# Patient Record
Sex: Female | Born: 1935 | Race: White | Hispanic: No | Smoking: Never smoker
Health system: Southern US, Community
[De-identification: ages and names within clinical notes are randomized; demographics above are authoritative.]

## PROBLEM LIST (undated history)

## (undated) HISTORY — PX: ABDOMINAL HYSTERECTOMY: SHX81

## (undated) HISTORY — PX: BREAST SURGERY: SHX581

## (undated) HISTORY — PX: BACK SURGERY: SHX140

## (undated) HISTORY — PX: CHOLECYSTECTOMY: SHX55

---

## 2013-05-30 DIAGNOSIS — K7689 Other specified diseases of liver: Secondary | ICD-10-CM | POA: Diagnosis not present

## 2013-05-30 DIAGNOSIS — Z950 Presence of cardiac pacemaker: Secondary | ICD-10-CM | POA: Diagnosis not present

## 2013-05-30 DIAGNOSIS — R404 Transient alteration of awareness: Secondary | ICD-10-CM | POA: Diagnosis not present

## 2013-05-30 DIAGNOSIS — N390 Urinary tract infection, site not specified: Secondary | ICD-10-CM | POA: Diagnosis not present

## 2013-05-30 DIAGNOSIS — Z885 Allergy status to narcotic agent status: Secondary | ICD-10-CM | POA: Diagnosis not present

## 2013-05-30 DIAGNOSIS — N3090 Cystitis, unspecified without hematuria: Secondary | ICD-10-CM | POA: Diagnosis not present

## 2013-05-30 DIAGNOSIS — E785 Hyperlipidemia, unspecified: Secondary | ICD-10-CM | POA: Diagnosis not present

## 2013-05-30 DIAGNOSIS — Z9889 Other specified postprocedural states: Secondary | ICD-10-CM | POA: Diagnosis not present

## 2013-05-30 DIAGNOSIS — R5383 Other fatigue: Secondary | ICD-10-CM | POA: Diagnosis not present

## 2013-05-30 DIAGNOSIS — K449 Diaphragmatic hernia without obstruction or gangrene: Secondary | ICD-10-CM | POA: Diagnosis not present

## 2013-05-30 DIAGNOSIS — R0602 Shortness of breath: Secondary | ICD-10-CM | POA: Diagnosis not present

## 2013-05-30 DIAGNOSIS — Z882 Allergy status to sulfonamides status: Secondary | ICD-10-CM | POA: Diagnosis not present

## 2013-05-30 DIAGNOSIS — Z96649 Presence of unspecified artificial hip joint: Secondary | ICD-10-CM | POA: Diagnosis not present

## 2013-05-30 DIAGNOSIS — R55 Syncope and collapse: Secondary | ICD-10-CM | POA: Diagnosis not present

## 2013-05-30 DIAGNOSIS — R5381 Other malaise: Secondary | ICD-10-CM | POA: Diagnosis not present

## 2013-05-30 DIAGNOSIS — R4182 Altered mental status, unspecified: Secondary | ICD-10-CM | POA: Diagnosis not present

## 2013-05-30 DIAGNOSIS — R791 Abnormal coagulation profile: Secondary | ICD-10-CM | POA: Diagnosis not present

## 2013-05-30 DIAGNOSIS — I4891 Unspecified atrial fibrillation: Secondary | ICD-10-CM | POA: Diagnosis not present

## 2013-05-30 DIAGNOSIS — R1011 Right upper quadrant pain: Secondary | ICD-10-CM | POA: Diagnosis not present

## 2013-05-30 DIAGNOSIS — J329 Chronic sinusitis, unspecified: Secondary | ICD-10-CM | POA: Diagnosis not present

## 2013-05-30 DIAGNOSIS — Z96659 Presence of unspecified artificial knee joint: Secondary | ICD-10-CM | POA: Diagnosis not present

## 2013-05-30 DIAGNOSIS — J069 Acute upper respiratory infection, unspecified: Secondary | ICD-10-CM | POA: Diagnosis not present

## 2013-05-30 DIAGNOSIS — Z7982 Long term (current) use of aspirin: Secondary | ICD-10-CM | POA: Diagnosis not present

## 2013-05-30 DIAGNOSIS — J40 Bronchitis, not specified as acute or chronic: Secondary | ICD-10-CM | POA: Diagnosis not present

## 2013-05-30 DIAGNOSIS — B961 Klebsiella pneumoniae [K. pneumoniae] as the cause of diseases classified elsewhere: Secondary | ICD-10-CM | POA: Diagnosis not present

## 2013-05-30 DIAGNOSIS — Z888 Allergy status to other drugs, medicaments and biological substances status: Secondary | ICD-10-CM | POA: Diagnosis not present

## 2013-05-30 DIAGNOSIS — I10 Essential (primary) hypertension: Secondary | ICD-10-CM | POA: Diagnosis not present

## 2013-05-30 DIAGNOSIS — Z853 Personal history of malignant neoplasm of breast: Secondary | ICD-10-CM | POA: Diagnosis not present

## 2013-05-30 DIAGNOSIS — I2510 Atherosclerotic heart disease of native coronary artery without angina pectoris: Secondary | ICD-10-CM | POA: Diagnosis not present

## 2013-05-30 DIAGNOSIS — R059 Cough, unspecified: Secondary | ICD-10-CM | POA: Diagnosis not present

## 2013-05-30 DIAGNOSIS — F411 Generalized anxiety disorder: Secondary | ICD-10-CM | POA: Diagnosis not present

## 2013-05-30 DIAGNOSIS — Z79899 Other long term (current) drug therapy: Secondary | ICD-10-CM | POA: Diagnosis not present

## 2013-05-30 DIAGNOSIS — R05 Cough: Secondary | ICD-10-CM | POA: Diagnosis not present

## 2013-05-31 DIAGNOSIS — R7309 Other abnormal glucose: Secondary | ICD-10-CM | POA: Diagnosis not present

## 2013-05-31 DIAGNOSIS — J40 Bronchitis, not specified as acute or chronic: Secondary | ICD-10-CM | POA: Diagnosis not present

## 2013-05-31 DIAGNOSIS — I10 Essential (primary) hypertension: Secondary | ICD-10-CM | POA: Diagnosis not present

## 2013-05-31 DIAGNOSIS — I4891 Unspecified atrial fibrillation: Secondary | ICD-10-CM | POA: Diagnosis not present

## 2013-05-31 DIAGNOSIS — R55 Syncope and collapse: Secondary | ICD-10-CM | POA: Diagnosis not present

## 2013-05-31 DIAGNOSIS — N390 Urinary tract infection, site not specified: Secondary | ICD-10-CM | POA: Diagnosis not present

## 2013-06-01 DIAGNOSIS — R059 Cough, unspecified: Secondary | ICD-10-CM | POA: Diagnosis not present

## 2013-06-01 DIAGNOSIS — R5381 Other malaise: Secondary | ICD-10-CM | POA: Diagnosis not present

## 2013-06-01 DIAGNOSIS — J40 Bronchitis, not specified as acute or chronic: Secondary | ICD-10-CM | POA: Diagnosis not present

## 2013-06-01 DIAGNOSIS — B9689 Other specified bacterial agents as the cause of diseases classified elsewhere: Secondary | ICD-10-CM | POA: Diagnosis not present

## 2013-06-01 DIAGNOSIS — R05 Cough: Secondary | ICD-10-CM | POA: Diagnosis not present

## 2013-06-02 DIAGNOSIS — N390 Urinary tract infection, site not specified: Secondary | ICD-10-CM | POA: Diagnosis not present

## 2013-06-02 DIAGNOSIS — R059 Cough, unspecified: Secondary | ICD-10-CM | POA: Diagnosis not present

## 2013-06-02 DIAGNOSIS — J40 Bronchitis, not specified as acute or chronic: Secondary | ICD-10-CM | POA: Diagnosis not present

## 2013-06-02 DIAGNOSIS — R55 Syncope and collapse: Secondary | ICD-10-CM | POA: Diagnosis not present

## 2013-06-02 DIAGNOSIS — R05 Cough: Secondary | ICD-10-CM | POA: Diagnosis not present

## 2013-06-04 DIAGNOSIS — F411 Generalized anxiety disorder: Secondary | ICD-10-CM | POA: Diagnosis not present

## 2013-06-04 DIAGNOSIS — B961 Klebsiella pneumoniae [K. pneumoniae] as the cause of diseases classified elsewhere: Secondary | ICD-10-CM | POA: Diagnosis not present

## 2013-06-04 DIAGNOSIS — G589 Mononeuropathy, unspecified: Secondary | ICD-10-CM | POA: Diagnosis not present

## 2013-06-04 DIAGNOSIS — M810 Age-related osteoporosis without current pathological fracture: Secondary | ICD-10-CM | POA: Diagnosis not present

## 2013-06-04 DIAGNOSIS — N390 Urinary tract infection, site not specified: Secondary | ICD-10-CM | POA: Diagnosis not present

## 2013-06-04 DIAGNOSIS — IMO0002 Reserved for concepts with insufficient information to code with codable children: Secondary | ICD-10-CM | POA: Diagnosis not present

## 2013-06-04 DIAGNOSIS — Z853 Personal history of malignant neoplasm of breast: Secondary | ICD-10-CM | POA: Diagnosis not present

## 2013-06-04 DIAGNOSIS — H3530 Unspecified macular degeneration: Secondary | ICD-10-CM | POA: Diagnosis not present

## 2013-06-04 DIAGNOSIS — M545 Low back pain, unspecified: Secondary | ICD-10-CM | POA: Diagnosis not present

## 2013-06-04 DIAGNOSIS — I10 Essential (primary) hypertension: Secondary | ICD-10-CM | POA: Diagnosis not present

## 2013-06-04 DIAGNOSIS — J40 Bronchitis, not specified as acute or chronic: Secondary | ICD-10-CM | POA: Diagnosis not present

## 2013-06-04 DIAGNOSIS — Z950 Presence of cardiac pacemaker: Secondary | ICD-10-CM | POA: Diagnosis not present

## 2013-06-05 DIAGNOSIS — J40 Bronchitis, not specified as acute or chronic: Secondary | ICD-10-CM | POA: Diagnosis not present

## 2013-06-05 DIAGNOSIS — B961 Klebsiella pneumoniae [K. pneumoniae] as the cause of diseases classified elsewhere: Secondary | ICD-10-CM | POA: Diagnosis not present

## 2013-06-05 DIAGNOSIS — IMO0002 Reserved for concepts with insufficient information to code with codable children: Secondary | ICD-10-CM | POA: Diagnosis not present

## 2013-06-05 DIAGNOSIS — G589 Mononeuropathy, unspecified: Secondary | ICD-10-CM | POA: Diagnosis not present

## 2013-06-05 DIAGNOSIS — N390 Urinary tract infection, site not specified: Secondary | ICD-10-CM | POA: Diagnosis not present

## 2013-06-05 DIAGNOSIS — I10 Essential (primary) hypertension: Secondary | ICD-10-CM | POA: Diagnosis not present

## 2013-06-06 DIAGNOSIS — N390 Urinary tract infection, site not specified: Secondary | ICD-10-CM | POA: Diagnosis not present

## 2013-06-06 DIAGNOSIS — I10 Essential (primary) hypertension: Secondary | ICD-10-CM | POA: Diagnosis not present

## 2013-06-06 DIAGNOSIS — R5381 Other malaise: Secondary | ICD-10-CM | POA: Diagnosis not present

## 2013-06-06 DIAGNOSIS — F411 Generalized anxiety disorder: Secondary | ICD-10-CM | POA: Diagnosis not present

## 2013-06-06 DIAGNOSIS — IMO0002 Reserved for concepts with insufficient information to code with codable children: Secondary | ICD-10-CM | POA: Diagnosis not present

## 2013-06-06 DIAGNOSIS — G589 Mononeuropathy, unspecified: Secondary | ICD-10-CM | POA: Diagnosis not present

## 2013-06-06 DIAGNOSIS — I4949 Other premature depolarization: Secondary | ICD-10-CM | POA: Diagnosis not present

## 2013-06-06 DIAGNOSIS — R5383 Other fatigue: Secondary | ICD-10-CM | POA: Diagnosis not present

## 2013-06-06 DIAGNOSIS — B961 Klebsiella pneumoniae [K. pneumoniae] as the cause of diseases classified elsewhere: Secondary | ICD-10-CM | POA: Diagnosis not present

## 2013-06-06 DIAGNOSIS — J40 Bronchitis, not specified as acute or chronic: Secondary | ICD-10-CM | POA: Diagnosis not present

## 2013-06-10 DIAGNOSIS — B961 Klebsiella pneumoniae [K. pneumoniae] as the cause of diseases classified elsewhere: Secondary | ICD-10-CM | POA: Diagnosis not present

## 2013-06-10 DIAGNOSIS — G589 Mononeuropathy, unspecified: Secondary | ICD-10-CM | POA: Diagnosis not present

## 2013-06-10 DIAGNOSIS — I10 Essential (primary) hypertension: Secondary | ICD-10-CM | POA: Diagnosis not present

## 2013-06-10 DIAGNOSIS — J40 Bronchitis, not specified as acute or chronic: Secondary | ICD-10-CM | POA: Diagnosis not present

## 2013-06-10 DIAGNOSIS — N390 Urinary tract infection, site not specified: Secondary | ICD-10-CM | POA: Diagnosis not present

## 2013-06-10 DIAGNOSIS — IMO0002 Reserved for concepts with insufficient information to code with codable children: Secondary | ICD-10-CM | POA: Diagnosis not present

## 2013-06-12 DIAGNOSIS — G589 Mononeuropathy, unspecified: Secondary | ICD-10-CM | POA: Diagnosis not present

## 2013-06-12 DIAGNOSIS — I10 Essential (primary) hypertension: Secondary | ICD-10-CM | POA: Diagnosis not present

## 2013-06-12 DIAGNOSIS — J40 Bronchitis, not specified as acute or chronic: Secondary | ICD-10-CM | POA: Diagnosis not present

## 2013-06-12 DIAGNOSIS — N390 Urinary tract infection, site not specified: Secondary | ICD-10-CM | POA: Diagnosis not present

## 2013-06-12 DIAGNOSIS — IMO0002 Reserved for concepts with insufficient information to code with codable children: Secondary | ICD-10-CM | POA: Diagnosis not present

## 2013-06-12 DIAGNOSIS — B961 Klebsiella pneumoniae [K. pneumoniae] as the cause of diseases classified elsewhere: Secondary | ICD-10-CM | POA: Diagnosis not present

## 2013-06-16 DIAGNOSIS — G589 Mononeuropathy, unspecified: Secondary | ICD-10-CM | POA: Diagnosis not present

## 2013-06-16 DIAGNOSIS — J40 Bronchitis, not specified as acute or chronic: Secondary | ICD-10-CM | POA: Diagnosis not present

## 2013-06-16 DIAGNOSIS — N390 Urinary tract infection, site not specified: Secondary | ICD-10-CM | POA: Diagnosis not present

## 2013-06-16 DIAGNOSIS — I10 Essential (primary) hypertension: Secondary | ICD-10-CM | POA: Diagnosis not present

## 2013-06-16 DIAGNOSIS — B961 Klebsiella pneumoniae [K. pneumoniae] as the cause of diseases classified elsewhere: Secondary | ICD-10-CM | POA: Diagnosis not present

## 2013-06-16 DIAGNOSIS — IMO0002 Reserved for concepts with insufficient information to code with codable children: Secondary | ICD-10-CM | POA: Diagnosis not present

## 2013-06-18 DIAGNOSIS — N390 Urinary tract infection, site not specified: Secondary | ICD-10-CM | POA: Diagnosis not present

## 2013-06-18 DIAGNOSIS — G589 Mononeuropathy, unspecified: Secondary | ICD-10-CM | POA: Diagnosis not present

## 2013-06-18 DIAGNOSIS — I10 Essential (primary) hypertension: Secondary | ICD-10-CM | POA: Diagnosis not present

## 2013-06-18 DIAGNOSIS — IMO0002 Reserved for concepts with insufficient information to code with codable children: Secondary | ICD-10-CM | POA: Diagnosis not present

## 2013-06-18 DIAGNOSIS — J40 Bronchitis, not specified as acute or chronic: Secondary | ICD-10-CM | POA: Diagnosis not present

## 2013-06-18 DIAGNOSIS — B961 Klebsiella pneumoniae [K. pneumoniae] as the cause of diseases classified elsewhere: Secondary | ICD-10-CM | POA: Diagnosis not present

## 2013-06-20 DIAGNOSIS — N390 Urinary tract infection, site not specified: Secondary | ICD-10-CM | POA: Diagnosis not present

## 2013-06-20 DIAGNOSIS — IMO0002 Reserved for concepts with insufficient information to code with codable children: Secondary | ICD-10-CM | POA: Diagnosis not present

## 2013-06-20 DIAGNOSIS — G589 Mononeuropathy, unspecified: Secondary | ICD-10-CM | POA: Diagnosis not present

## 2013-06-20 DIAGNOSIS — I10 Essential (primary) hypertension: Secondary | ICD-10-CM | POA: Diagnosis not present

## 2013-06-20 DIAGNOSIS — B961 Klebsiella pneumoniae [K. pneumoniae] as the cause of diseases classified elsewhere: Secondary | ICD-10-CM | POA: Diagnosis not present

## 2013-06-20 DIAGNOSIS — J40 Bronchitis, not specified as acute or chronic: Secondary | ICD-10-CM | POA: Diagnosis not present

## 2013-06-22 DIAGNOSIS — G589 Mononeuropathy, unspecified: Secondary | ICD-10-CM | POA: Diagnosis not present

## 2013-06-22 DIAGNOSIS — N390 Urinary tract infection, site not specified: Secondary | ICD-10-CM | POA: Diagnosis not present

## 2013-06-22 DIAGNOSIS — B961 Klebsiella pneumoniae [K. pneumoniae] as the cause of diseases classified elsewhere: Secondary | ICD-10-CM | POA: Diagnosis not present

## 2013-06-22 DIAGNOSIS — IMO0002 Reserved for concepts with insufficient information to code with codable children: Secondary | ICD-10-CM | POA: Diagnosis not present

## 2013-06-22 DIAGNOSIS — J40 Bronchitis, not specified as acute or chronic: Secondary | ICD-10-CM | POA: Diagnosis not present

## 2013-06-22 DIAGNOSIS — I10 Essential (primary) hypertension: Secondary | ICD-10-CM | POA: Diagnosis not present

## 2013-06-24 DIAGNOSIS — R443 Hallucinations, unspecified: Secondary | ICD-10-CM | POA: Diagnosis not present

## 2013-06-24 DIAGNOSIS — M25569 Pain in unspecified knee: Secondary | ICD-10-CM | POA: Diagnosis not present

## 2013-06-24 DIAGNOSIS — E785 Hyperlipidemia, unspecified: Secondary | ICD-10-CM | POA: Diagnosis not present

## 2013-06-24 DIAGNOSIS — I10 Essential (primary) hypertension: Secondary | ICD-10-CM | POA: Diagnosis not present

## 2013-06-24 DIAGNOSIS — I2510 Atherosclerotic heart disease of native coronary artery without angina pectoris: Secondary | ICD-10-CM | POA: Diagnosis not present

## 2013-06-24 DIAGNOSIS — E876 Hypokalemia: Secondary | ICD-10-CM | POA: Diagnosis not present

## 2013-06-24 DIAGNOSIS — F411 Generalized anxiety disorder: Secondary | ICD-10-CM | POA: Diagnosis not present

## 2013-06-24 DIAGNOSIS — Z79899 Other long term (current) drug therapy: Secondary | ICD-10-CM | POA: Diagnosis not present

## 2013-06-25 DIAGNOSIS — B961 Klebsiella pneumoniae [K. pneumoniae] as the cause of diseases classified elsewhere: Secondary | ICD-10-CM | POA: Diagnosis not present

## 2013-06-25 DIAGNOSIS — I10 Essential (primary) hypertension: Secondary | ICD-10-CM | POA: Diagnosis not present

## 2013-06-25 DIAGNOSIS — N390 Urinary tract infection, site not specified: Secondary | ICD-10-CM | POA: Diagnosis not present

## 2013-06-25 DIAGNOSIS — G589 Mononeuropathy, unspecified: Secondary | ICD-10-CM | POA: Diagnosis not present

## 2013-06-25 DIAGNOSIS — IMO0002 Reserved for concepts with insufficient information to code with codable children: Secondary | ICD-10-CM | POA: Diagnosis not present

## 2013-06-25 DIAGNOSIS — J40 Bronchitis, not specified as acute or chronic: Secondary | ICD-10-CM | POA: Diagnosis not present

## 2013-06-26 DIAGNOSIS — N390 Urinary tract infection, site not specified: Secondary | ICD-10-CM | POA: Diagnosis not present

## 2013-06-26 DIAGNOSIS — I10 Essential (primary) hypertension: Secondary | ICD-10-CM | POA: Diagnosis not present

## 2013-06-26 DIAGNOSIS — G589 Mononeuropathy, unspecified: Secondary | ICD-10-CM | POA: Diagnosis not present

## 2013-06-26 DIAGNOSIS — J40 Bronchitis, not specified as acute or chronic: Secondary | ICD-10-CM | POA: Diagnosis not present

## 2013-06-26 DIAGNOSIS — B961 Klebsiella pneumoniae [K. pneumoniae] as the cause of diseases classified elsewhere: Secondary | ICD-10-CM | POA: Diagnosis not present

## 2013-06-26 DIAGNOSIS — IMO0002 Reserved for concepts with insufficient information to code with codable children: Secondary | ICD-10-CM | POA: Diagnosis not present

## 2013-07-01 DIAGNOSIS — IMO0002 Reserved for concepts with insufficient information to code with codable children: Secondary | ICD-10-CM | POA: Diagnosis not present

## 2013-07-01 DIAGNOSIS — J40 Bronchitis, not specified as acute or chronic: Secondary | ICD-10-CM | POA: Diagnosis not present

## 2013-07-01 DIAGNOSIS — N390 Urinary tract infection, site not specified: Secondary | ICD-10-CM | POA: Diagnosis not present

## 2013-07-01 DIAGNOSIS — I10 Essential (primary) hypertension: Secondary | ICD-10-CM | POA: Diagnosis not present

## 2013-07-01 DIAGNOSIS — B961 Klebsiella pneumoniae [K. pneumoniae] as the cause of diseases classified elsewhere: Secondary | ICD-10-CM | POA: Diagnosis not present

## 2013-07-01 DIAGNOSIS — G589 Mononeuropathy, unspecified: Secondary | ICD-10-CM | POA: Diagnosis not present

## 2013-07-02 DIAGNOSIS — N390 Urinary tract infection, site not specified: Secondary | ICD-10-CM | POA: Diagnosis not present

## 2013-07-02 DIAGNOSIS — G589 Mononeuropathy, unspecified: Secondary | ICD-10-CM | POA: Diagnosis not present

## 2013-07-02 DIAGNOSIS — IMO0002 Reserved for concepts with insufficient information to code with codable children: Secondary | ICD-10-CM | POA: Diagnosis not present

## 2013-07-02 DIAGNOSIS — B961 Klebsiella pneumoniae [K. pneumoniae] as the cause of diseases classified elsewhere: Secondary | ICD-10-CM | POA: Diagnosis not present

## 2013-07-02 DIAGNOSIS — J40 Bronchitis, not specified as acute or chronic: Secondary | ICD-10-CM | POA: Diagnosis not present

## 2013-07-02 DIAGNOSIS — I10 Essential (primary) hypertension: Secondary | ICD-10-CM | POA: Diagnosis not present

## 2013-07-04 DIAGNOSIS — G589 Mononeuropathy, unspecified: Secondary | ICD-10-CM | POA: Diagnosis not present

## 2013-07-04 DIAGNOSIS — B961 Klebsiella pneumoniae [K. pneumoniae] as the cause of diseases classified elsewhere: Secondary | ICD-10-CM | POA: Diagnosis not present

## 2013-07-04 DIAGNOSIS — N390 Urinary tract infection, site not specified: Secondary | ICD-10-CM | POA: Diagnosis not present

## 2013-07-04 DIAGNOSIS — IMO0002 Reserved for concepts with insufficient information to code with codable children: Secondary | ICD-10-CM | POA: Diagnosis not present

## 2013-07-04 DIAGNOSIS — I10 Essential (primary) hypertension: Secondary | ICD-10-CM | POA: Diagnosis not present

## 2013-07-04 DIAGNOSIS — J40 Bronchitis, not specified as acute or chronic: Secondary | ICD-10-CM | POA: Diagnosis not present

## 2013-07-08 DIAGNOSIS — N390 Urinary tract infection, site not specified: Secondary | ICD-10-CM | POA: Diagnosis not present

## 2013-07-08 DIAGNOSIS — B961 Klebsiella pneumoniae [K. pneumoniae] as the cause of diseases classified elsewhere: Secondary | ICD-10-CM | POA: Diagnosis not present

## 2013-07-08 DIAGNOSIS — G589 Mononeuropathy, unspecified: Secondary | ICD-10-CM | POA: Diagnosis not present

## 2013-07-08 DIAGNOSIS — I10 Essential (primary) hypertension: Secondary | ICD-10-CM | POA: Diagnosis not present

## 2013-07-08 DIAGNOSIS — IMO0002 Reserved for concepts with insufficient information to code with codable children: Secondary | ICD-10-CM | POA: Diagnosis not present

## 2013-07-08 DIAGNOSIS — J40 Bronchitis, not specified as acute or chronic: Secondary | ICD-10-CM | POA: Diagnosis not present

## 2013-07-10 DIAGNOSIS — J40 Bronchitis, not specified as acute or chronic: Secondary | ICD-10-CM | POA: Diagnosis not present

## 2013-07-10 DIAGNOSIS — N390 Urinary tract infection, site not specified: Secondary | ICD-10-CM | POA: Diagnosis not present

## 2013-07-10 DIAGNOSIS — I495 Sick sinus syndrome: Secondary | ICD-10-CM | POA: Diagnosis not present

## 2013-07-10 DIAGNOSIS — B961 Klebsiella pneumoniae [K. pneumoniae] as the cause of diseases classified elsewhere: Secondary | ICD-10-CM | POA: Diagnosis not present

## 2013-07-10 DIAGNOSIS — G589 Mononeuropathy, unspecified: Secondary | ICD-10-CM | POA: Diagnosis not present

## 2013-07-10 DIAGNOSIS — R0989 Other specified symptoms and signs involving the circulatory and respiratory systems: Secondary | ICD-10-CM | POA: Diagnosis not present

## 2013-07-10 DIAGNOSIS — I10 Essential (primary) hypertension: Secondary | ICD-10-CM | POA: Diagnosis not present

## 2013-07-10 DIAGNOSIS — R0609 Other forms of dyspnea: Secondary | ICD-10-CM | POA: Diagnosis not present

## 2013-07-10 DIAGNOSIS — IMO0002 Reserved for concepts with insufficient information to code with codable children: Secondary | ICD-10-CM | POA: Diagnosis not present

## 2013-07-10 DIAGNOSIS — Z950 Presence of cardiac pacemaker: Secondary | ICD-10-CM | POA: Diagnosis not present

## 2013-07-14 DIAGNOSIS — H353290 Exudative age-related macular degeneration, unspecified eye, stage unspecified: Secondary | ICD-10-CM | POA: Diagnosis not present

## 2013-07-14 DIAGNOSIS — Z961 Presence of intraocular lens: Secondary | ICD-10-CM | POA: Diagnosis not present

## 2013-07-14 DIAGNOSIS — H31019 Macula scars of posterior pole (postinflammatory) (post-traumatic), unspecified eye: Secondary | ICD-10-CM | POA: Diagnosis not present

## 2013-07-17 DIAGNOSIS — J40 Bronchitis, not specified as acute or chronic: Secondary | ICD-10-CM | POA: Diagnosis not present

## 2013-07-17 DIAGNOSIS — IMO0002 Reserved for concepts with insufficient information to code with codable children: Secondary | ICD-10-CM | POA: Diagnosis not present

## 2013-07-17 DIAGNOSIS — I10 Essential (primary) hypertension: Secondary | ICD-10-CM | POA: Diagnosis not present

## 2013-07-17 DIAGNOSIS — B961 Klebsiella pneumoniae [K. pneumoniae] as the cause of diseases classified elsewhere: Secondary | ICD-10-CM | POA: Diagnosis not present

## 2013-07-17 DIAGNOSIS — G589 Mononeuropathy, unspecified: Secondary | ICD-10-CM | POA: Diagnosis not present

## 2013-07-17 DIAGNOSIS — N390 Urinary tract infection, site not specified: Secondary | ICD-10-CM | POA: Diagnosis not present

## 2013-07-18 DIAGNOSIS — N390 Urinary tract infection, site not specified: Secondary | ICD-10-CM | POA: Diagnosis not present

## 2013-07-18 DIAGNOSIS — G589 Mononeuropathy, unspecified: Secondary | ICD-10-CM | POA: Diagnosis not present

## 2013-07-18 DIAGNOSIS — I10 Essential (primary) hypertension: Secondary | ICD-10-CM | POA: Diagnosis not present

## 2013-07-18 DIAGNOSIS — B961 Klebsiella pneumoniae [K. pneumoniae] as the cause of diseases classified elsewhere: Secondary | ICD-10-CM | POA: Diagnosis not present

## 2013-07-18 DIAGNOSIS — J40 Bronchitis, not specified as acute or chronic: Secondary | ICD-10-CM | POA: Diagnosis not present

## 2013-07-18 DIAGNOSIS — IMO0002 Reserved for concepts with insufficient information to code with codable children: Secondary | ICD-10-CM | POA: Diagnosis not present

## 2013-07-30 DIAGNOSIS — I10 Essential (primary) hypertension: Secondary | ICD-10-CM | POA: Diagnosis not present

## 2013-07-30 DIAGNOSIS — B961 Klebsiella pneumoniae [K. pneumoniae] as the cause of diseases classified elsewhere: Secondary | ICD-10-CM | POA: Diagnosis not present

## 2013-07-30 DIAGNOSIS — G589 Mononeuropathy, unspecified: Secondary | ICD-10-CM | POA: Diagnosis not present

## 2013-07-30 DIAGNOSIS — IMO0002 Reserved for concepts with insufficient information to code with codable children: Secondary | ICD-10-CM | POA: Diagnosis not present

## 2013-07-30 DIAGNOSIS — N390 Urinary tract infection, site not specified: Secondary | ICD-10-CM | POA: Diagnosis not present

## 2013-07-30 DIAGNOSIS — J40 Bronchitis, not specified as acute or chronic: Secondary | ICD-10-CM | POA: Diagnosis not present

## 2013-08-01 DIAGNOSIS — I2510 Atherosclerotic heart disease of native coronary artery without angina pectoris: Secondary | ICD-10-CM | POA: Diagnosis not present

## 2013-08-01 DIAGNOSIS — G4452 New daily persistent headache (NDPH): Secondary | ICD-10-CM | POA: Diagnosis not present

## 2013-08-04 DIAGNOSIS — E785 Hyperlipidemia, unspecified: Secondary | ICD-10-CM | POA: Diagnosis not present

## 2013-08-04 DIAGNOSIS — Z950 Presence of cardiac pacemaker: Secondary | ICD-10-CM | POA: Diagnosis not present

## 2013-08-04 DIAGNOSIS — Z886 Allergy status to analgesic agent status: Secondary | ICD-10-CM | POA: Diagnosis not present

## 2013-08-04 DIAGNOSIS — I10 Essential (primary) hypertension: Secondary | ICD-10-CM | POA: Diagnosis not present

## 2013-08-04 DIAGNOSIS — Z7982 Long term (current) use of aspirin: Secondary | ICD-10-CM | POA: Diagnosis not present

## 2013-08-04 DIAGNOSIS — M5137 Other intervertebral disc degeneration, lumbosacral region: Secondary | ICD-10-CM | POA: Diagnosis not present

## 2013-08-04 DIAGNOSIS — Z9010 Acquired absence of unspecified breast and nipple: Secondary | ICD-10-CM | POA: Diagnosis not present

## 2013-08-04 DIAGNOSIS — Z882 Allergy status to sulfonamides status: Secondary | ICD-10-CM | POA: Diagnosis not present

## 2013-08-04 DIAGNOSIS — M949 Disorder of cartilage, unspecified: Secondary | ICD-10-CM | POA: Diagnosis not present

## 2013-08-04 DIAGNOSIS — F411 Generalized anxiety disorder: Secondary | ICD-10-CM | POA: Diagnosis not present

## 2013-08-04 DIAGNOSIS — I2510 Atherosclerotic heart disease of native coronary artery without angina pectoris: Secondary | ICD-10-CM | POA: Diagnosis not present

## 2013-08-04 DIAGNOSIS — G589 Mononeuropathy, unspecified: Secondary | ICD-10-CM | POA: Diagnosis not present

## 2013-08-04 DIAGNOSIS — K449 Diaphragmatic hernia without obstruction or gangrene: Secondary | ICD-10-CM | POA: Diagnosis not present

## 2013-08-04 DIAGNOSIS — Z853 Personal history of malignant neoplasm of breast: Secondary | ICD-10-CM | POA: Diagnosis not present

## 2013-08-04 DIAGNOSIS — Z888 Allergy status to other drugs, medicaments and biological substances status: Secondary | ICD-10-CM | POA: Diagnosis not present

## 2013-08-04 DIAGNOSIS — Z79899 Other long term (current) drug therapy: Secondary | ICD-10-CM | POA: Diagnosis not present

## 2013-08-04 DIAGNOSIS — R51 Headache: Secondary | ICD-10-CM | POA: Diagnosis not present

## 2013-08-04 DIAGNOSIS — M899 Disorder of bone, unspecified: Secondary | ICD-10-CM | POA: Diagnosis not present

## 2013-08-04 DIAGNOSIS — Z90710 Acquired absence of both cervix and uterus: Secondary | ICD-10-CM | POA: Diagnosis not present

## 2013-08-13 DIAGNOSIS — I2510 Atherosclerotic heart disease of native coronary artery without angina pectoris: Secondary | ICD-10-CM | POA: Diagnosis not present

## 2013-08-13 DIAGNOSIS — G43009 Migraine without aura, not intractable, without status migrainosus: Secondary | ICD-10-CM | POA: Diagnosis not present

## 2013-09-16 DIAGNOSIS — E876 Hypokalemia: Secondary | ICD-10-CM | POA: Diagnosis not present

## 2013-09-16 DIAGNOSIS — M25569 Pain in unspecified knee: Secondary | ICD-10-CM | POA: Diagnosis not present

## 2013-09-16 DIAGNOSIS — I10 Essential (primary) hypertension: Secondary | ICD-10-CM | POA: Diagnosis not present

## 2013-09-16 DIAGNOSIS — Z79899 Other long term (current) drug therapy: Secondary | ICD-10-CM | POA: Diagnosis not present

## 2013-09-16 DIAGNOSIS — E785 Hyperlipidemia, unspecified: Secondary | ICD-10-CM | POA: Diagnosis not present

## 2013-09-17 DIAGNOSIS — M1710 Unilateral primary osteoarthritis, unspecified knee: Secondary | ICD-10-CM | POA: Diagnosis not present

## 2013-09-23 DIAGNOSIS — I10 Essential (primary) hypertension: Secondary | ICD-10-CM | POA: Diagnosis not present

## 2013-09-23 DIAGNOSIS — R350 Frequency of micturition: Secondary | ICD-10-CM | POA: Diagnosis not present

## 2013-09-23 DIAGNOSIS — E785 Hyperlipidemia, unspecified: Secondary | ICD-10-CM | POA: Diagnosis not present

## 2013-09-23 DIAGNOSIS — R32 Unspecified urinary incontinence: Secondary | ICD-10-CM | POA: Diagnosis not present

## 2013-09-23 DIAGNOSIS — I2510 Atherosclerotic heart disease of native coronary artery without angina pectoris: Secondary | ICD-10-CM | POA: Diagnosis not present

## 2013-09-23 DIAGNOSIS — F411 Generalized anxiety disorder: Secondary | ICD-10-CM | POA: Diagnosis not present

## 2013-10-09 DIAGNOSIS — Z950 Presence of cardiac pacemaker: Secondary | ICD-10-CM | POA: Diagnosis not present

## 2013-10-09 DIAGNOSIS — I495 Sick sinus syndrome: Secondary | ICD-10-CM | POA: Diagnosis not present

## 2013-10-09 DIAGNOSIS — R0609 Other forms of dyspnea: Secondary | ICD-10-CM | POA: Diagnosis not present

## 2013-10-13 DIAGNOSIS — Z961 Presence of intraocular lens: Secondary | ICD-10-CM | POA: Diagnosis not present

## 2013-10-13 DIAGNOSIS — H31019 Macula scars of posterior pole (postinflammatory) (post-traumatic), unspecified eye: Secondary | ICD-10-CM | POA: Diagnosis not present

## 2013-10-13 DIAGNOSIS — H353290 Exudative age-related macular degeneration, unspecified eye, stage unspecified: Secondary | ICD-10-CM | POA: Diagnosis not present

## 2013-10-28 DIAGNOSIS — G43009 Migraine without aura, not intractable, without status migrainosus: Secondary | ICD-10-CM | POA: Diagnosis not present

## 2013-10-28 DIAGNOSIS — I2510 Atherosclerotic heart disease of native coronary artery without angina pectoris: Secondary | ICD-10-CM | POA: Diagnosis not present

## 2014-01-05 DIAGNOSIS — H31019 Macula scars of posterior pole (postinflammatory) (post-traumatic), unspecified eye: Secondary | ICD-10-CM | POA: Diagnosis not present

## 2014-01-05 DIAGNOSIS — Z961 Presence of intraocular lens: Secondary | ICD-10-CM | POA: Diagnosis not present

## 2014-01-05 DIAGNOSIS — H353290 Exudative age-related macular degeneration, unspecified eye, stage unspecified: Secondary | ICD-10-CM | POA: Diagnosis not present

## 2014-01-06 DIAGNOSIS — F411 Generalized anxiety disorder: Secondary | ICD-10-CM | POA: Diagnosis not present

## 2014-01-06 DIAGNOSIS — E785 Hyperlipidemia, unspecified: Secondary | ICD-10-CM | POA: Diagnosis not present

## 2014-01-06 DIAGNOSIS — G43009 Migraine without aura, not intractable, without status migrainosus: Secondary | ICD-10-CM | POA: Diagnosis not present

## 2014-01-06 DIAGNOSIS — I2510 Atherosclerotic heart disease of native coronary artery without angina pectoris: Secondary | ICD-10-CM | POA: Diagnosis not present

## 2014-01-06 DIAGNOSIS — I10 Essential (primary) hypertension: Secondary | ICD-10-CM | POA: Diagnosis not present

## 2014-01-08 DIAGNOSIS — I4891 Unspecified atrial fibrillation: Secondary | ICD-10-CM | POA: Diagnosis not present

## 2014-01-08 DIAGNOSIS — Z950 Presence of cardiac pacemaker: Secondary | ICD-10-CM | POA: Diagnosis not present

## 2014-01-08 DIAGNOSIS — I495 Sick sinus syndrome: Secondary | ICD-10-CM | POA: Diagnosis not present

## 2014-01-08 DIAGNOSIS — I2510 Atherosclerotic heart disease of native coronary artery without angina pectoris: Secondary | ICD-10-CM | POA: Diagnosis not present

## 2014-01-08 DIAGNOSIS — Z45018 Encounter for adjustment and management of other part of cardiac pacemaker: Secondary | ICD-10-CM | POA: Diagnosis not present

## 2014-01-08 DIAGNOSIS — I10 Essential (primary) hypertension: Secondary | ICD-10-CM | POA: Diagnosis not present

## 2014-02-11 DIAGNOSIS — Z23 Encounter for immunization: Secondary | ICD-10-CM | POA: Diagnosis not present

## 2014-03-30 DIAGNOSIS — H3532 Exudative age-related macular degeneration: Secondary | ICD-10-CM | POA: Diagnosis not present

## 2014-03-30 DIAGNOSIS — I2510 Atherosclerotic heart disease of native coronary artery without angina pectoris: Secondary | ICD-10-CM | POA: Diagnosis not present

## 2014-03-30 DIAGNOSIS — I480 Paroxysmal atrial fibrillation: Secondary | ICD-10-CM | POA: Diagnosis not present

## 2014-03-30 DIAGNOSIS — R079 Chest pain, unspecified: Secondary | ICD-10-CM | POA: Diagnosis not present

## 2014-03-30 DIAGNOSIS — I4430 Unspecified atrioventricular block: Secondary | ICD-10-CM | POA: Diagnosis not present

## 2014-04-13 DIAGNOSIS — H3532 Exudative age-related macular degeneration: Secondary | ICD-10-CM | POA: Diagnosis not present

## 2014-04-21 DIAGNOSIS — R079 Chest pain, unspecified: Secondary | ICD-10-CM | POA: Diagnosis not present

## 2014-04-21 DIAGNOSIS — K449 Diaphragmatic hernia without obstruction or gangrene: Secondary | ICD-10-CM | POA: Diagnosis not present

## 2014-04-21 DIAGNOSIS — E785 Hyperlipidemia, unspecified: Secondary | ICD-10-CM | POA: Diagnosis not present

## 2014-04-21 DIAGNOSIS — I10 Essential (primary) hypertension: Secondary | ICD-10-CM | POA: Diagnosis not present

## 2014-04-21 DIAGNOSIS — Z79899 Other long term (current) drug therapy: Secondary | ICD-10-CM | POA: Diagnosis not present

## 2014-04-21 DIAGNOSIS — I2510 Atherosclerotic heart disease of native coronary artery without angina pectoris: Secondary | ICD-10-CM | POA: Diagnosis not present

## 2014-04-21 DIAGNOSIS — K219 Gastro-esophageal reflux disease without esophagitis: Secondary | ICD-10-CM | POA: Diagnosis not present

## 2014-07-09 DIAGNOSIS — H3532 Exudative age-related macular degeneration: Secondary | ICD-10-CM | POA: Diagnosis not present

## 2014-07-09 DIAGNOSIS — H31011 Macula scars of posterior pole (postinflammatory) (post-traumatic), right eye: Secondary | ICD-10-CM | POA: Diagnosis not present

## 2014-07-10 DIAGNOSIS — R001 Bradycardia, unspecified: Secondary | ICD-10-CM | POA: Diagnosis not present

## 2014-07-10 DIAGNOSIS — Z950 Presence of cardiac pacemaker: Secondary | ICD-10-CM | POA: Diagnosis not present

## 2014-07-23 DIAGNOSIS — I10 Essential (primary) hypertension: Secondary | ICD-10-CM | POA: Diagnosis not present

## 2014-07-23 DIAGNOSIS — M25561 Pain in right knee: Secondary | ICD-10-CM | POA: Diagnosis not present

## 2014-07-23 DIAGNOSIS — E785 Hyperlipidemia, unspecified: Secondary | ICD-10-CM | POA: Diagnosis not present

## 2014-07-23 DIAGNOSIS — Z471 Aftercare following joint replacement surgery: Secondary | ICD-10-CM | POA: Diagnosis not present

## 2014-07-23 DIAGNOSIS — Z79899 Other long term (current) drug therapy: Secondary | ICD-10-CM | POA: Diagnosis not present

## 2014-07-23 DIAGNOSIS — K219 Gastro-esophageal reflux disease without esophagitis: Secondary | ICD-10-CM | POA: Diagnosis not present

## 2014-07-23 DIAGNOSIS — I2510 Atherosclerotic heart disease of native coronary artery without angina pectoris: Secondary | ICD-10-CM | POA: Diagnosis not present

## 2014-07-23 DIAGNOSIS — K449 Diaphragmatic hernia without obstruction or gangrene: Secondary | ICD-10-CM | POA: Diagnosis not present

## 2014-08-10 DIAGNOSIS — I2510 Atherosclerotic heart disease of native coronary artery without angina pectoris: Secondary | ICD-10-CM | POA: Diagnosis not present

## 2014-08-10 DIAGNOSIS — G43709 Chronic migraine without aura, not intractable, without status migrainosus: Secondary | ICD-10-CM | POA: Diagnosis not present

## 2014-09-21 DIAGNOSIS — M25562 Pain in left knee: Secondary | ICD-10-CM | POA: Diagnosis not present

## 2014-09-21 DIAGNOSIS — M25561 Pain in right knee: Secondary | ICD-10-CM | POA: Diagnosis not present

## 2014-09-24 DIAGNOSIS — H31011 Macula scars of posterior pole (postinflammatory) (post-traumatic), right eye: Secondary | ICD-10-CM | POA: Diagnosis not present

## 2014-09-24 DIAGNOSIS — H3532 Exudative age-related macular degeneration: Secondary | ICD-10-CM | POA: Diagnosis not present

## 2014-10-22 DIAGNOSIS — E785 Hyperlipidemia, unspecified: Secondary | ICD-10-CM | POA: Diagnosis not present

## 2014-10-22 DIAGNOSIS — Z79899 Other long term (current) drug therapy: Secondary | ICD-10-CM | POA: Diagnosis not present

## 2014-10-22 DIAGNOSIS — R7309 Other abnormal glucose: Secondary | ICD-10-CM | POA: Diagnosis not present

## 2014-10-30 DIAGNOSIS — I10 Essential (primary) hypertension: Secondary | ICD-10-CM | POA: Diagnosis not present

## 2014-10-30 DIAGNOSIS — Z79899 Other long term (current) drug therapy: Secondary | ICD-10-CM | POA: Diagnosis not present

## 2014-10-30 DIAGNOSIS — M545 Low back pain: Secondary | ICD-10-CM | POA: Diagnosis not present

## 2014-10-30 DIAGNOSIS — E785 Hyperlipidemia, unspecified: Secondary | ICD-10-CM | POA: Diagnosis not present

## 2014-10-30 DIAGNOSIS — I2510 Atherosclerotic heart disease of native coronary artery without angina pectoris: Secondary | ICD-10-CM | POA: Diagnosis not present

## 2014-12-17 DIAGNOSIS — M7989 Other specified soft tissue disorders: Secondary | ICD-10-CM | POA: Diagnosis not present

## 2014-12-17 DIAGNOSIS — I2510 Atherosclerotic heart disease of native coronary artery without angina pectoris: Secondary | ICD-10-CM | POA: Diagnosis not present

## 2014-12-30 DIAGNOSIS — Z955 Presence of coronary angioplasty implant and graft: Secondary | ICD-10-CM | POA: Diagnosis not present

## 2014-12-30 DIAGNOSIS — R55 Syncope and collapse: Secondary | ICD-10-CM | POA: Diagnosis not present

## 2014-12-30 DIAGNOSIS — R404 Transient alteration of awareness: Secondary | ICD-10-CM | POA: Diagnosis not present

## 2014-12-30 DIAGNOSIS — R531 Weakness: Secondary | ICD-10-CM | POA: Diagnosis not present

## 2014-12-30 DIAGNOSIS — I10 Essential (primary) hypertension: Secondary | ICD-10-CM | POA: Diagnosis not present

## 2014-12-30 DIAGNOSIS — N390 Urinary tract infection, site not specified: Secondary | ICD-10-CM | POA: Diagnosis not present

## 2014-12-30 DIAGNOSIS — Z7982 Long term (current) use of aspirin: Secondary | ICD-10-CM | POA: Diagnosis not present

## 2014-12-30 DIAGNOSIS — I440 Atrioventricular block, first degree: Secondary | ICD-10-CM | POA: Diagnosis not present

## 2014-12-30 DIAGNOSIS — Z79899 Other long term (current) drug therapy: Secondary | ICD-10-CM | POA: Diagnosis not present

## 2014-12-30 DIAGNOSIS — Z853 Personal history of malignant neoplasm of breast: Secondary | ICD-10-CM | POA: Diagnosis not present

## 2014-12-30 DIAGNOSIS — I2510 Atherosclerotic heart disease of native coronary artery without angina pectoris: Secondary | ICD-10-CM | POA: Diagnosis not present

## 2014-12-30 DIAGNOSIS — I517 Cardiomegaly: Secondary | ICD-10-CM | POA: Diagnosis not present

## 2014-12-30 DIAGNOSIS — Z886 Allergy status to analgesic agent status: Secondary | ICD-10-CM | POA: Diagnosis not present

## 2014-12-30 DIAGNOSIS — M5136 Other intervertebral disc degeneration, lumbar region: Secondary | ICD-10-CM | POA: Diagnosis not present

## 2014-12-30 DIAGNOSIS — E785 Hyperlipidemia, unspecified: Secondary | ICD-10-CM | POA: Diagnosis not present

## 2014-12-30 DIAGNOSIS — Z882 Allergy status to sulfonamides status: Secondary | ICD-10-CM | POA: Diagnosis not present

## 2014-12-30 DIAGNOSIS — B961 Klebsiella pneumoniae [K. pneumoniae] as the cause of diseases classified elsewhere: Secondary | ICD-10-CM | POA: Diagnosis not present

## 2014-12-30 DIAGNOSIS — Z888 Allergy status to other drugs, medicaments and biological substances status: Secondary | ICD-10-CM | POA: Diagnosis not present

## 2014-12-30 DIAGNOSIS — Z950 Presence of cardiac pacemaker: Secondary | ICD-10-CM | POA: Diagnosis not present

## 2014-12-30 DIAGNOSIS — R001 Bradycardia, unspecified: Secondary | ICD-10-CM | POA: Diagnosis not present

## 2014-12-31 DIAGNOSIS — Z950 Presence of cardiac pacemaker: Secondary | ICD-10-CM | POA: Diagnosis not present

## 2014-12-31 DIAGNOSIS — I081 Rheumatic disorders of both mitral and tricuspid valves: Secondary | ICD-10-CM | POA: Diagnosis not present

## 2014-12-31 DIAGNOSIS — I519 Heart disease, unspecified: Secondary | ICD-10-CM | POA: Diagnosis not present

## 2014-12-31 DIAGNOSIS — R55 Syncope and collapse: Secondary | ICD-10-CM | POA: Diagnosis not present

## 2014-12-31 DIAGNOSIS — N390 Urinary tract infection, site not specified: Secondary | ICD-10-CM | POA: Diagnosis not present

## 2014-12-31 DIAGNOSIS — I517 Cardiomegaly: Secondary | ICD-10-CM | POA: Diagnosis not present

## 2015-01-08 DIAGNOSIS — R001 Bradycardia, unspecified: Secondary | ICD-10-CM | POA: Diagnosis not present

## 2015-01-08 DIAGNOSIS — Z950 Presence of cardiac pacemaker: Secondary | ICD-10-CM | POA: Diagnosis not present

## 2015-01-12 DIAGNOSIS — I2510 Atherosclerotic heart disease of native coronary artery without angina pectoris: Secondary | ICD-10-CM | POA: Diagnosis not present

## 2015-01-12 DIAGNOSIS — N390 Urinary tract infection, site not specified: Secondary | ICD-10-CM | POA: Diagnosis not present

## 2015-01-12 DIAGNOSIS — M549 Dorsalgia, unspecified: Secondary | ICD-10-CM | POA: Diagnosis not present

## 2015-02-04 DIAGNOSIS — R300 Dysuria: Secondary | ICD-10-CM | POA: Diagnosis not present

## 2015-02-04 DIAGNOSIS — H31011 Macula scars of posterior pole (postinflammatory) (post-traumatic), right eye: Secondary | ICD-10-CM | POA: Diagnosis not present

## 2015-02-04 DIAGNOSIS — R32 Unspecified urinary incontinence: Secondary | ICD-10-CM | POA: Diagnosis not present

## 2015-02-04 DIAGNOSIS — N3000 Acute cystitis without hematuria: Secondary | ICD-10-CM | POA: Diagnosis not present

## 2015-02-04 DIAGNOSIS — I2510 Atherosclerotic heart disease of native coronary artery without angina pectoris: Secondary | ICD-10-CM | POA: Diagnosis not present

## 2015-02-04 DIAGNOSIS — N390 Urinary tract infection, site not specified: Secondary | ICD-10-CM | POA: Diagnosis not present

## 2015-02-04 DIAGNOSIS — H3532 Exudative age-related macular degeneration: Secondary | ICD-10-CM | POA: Diagnosis not present

## 2015-02-04 DIAGNOSIS — R319 Hematuria, unspecified: Secondary | ICD-10-CM | POA: Diagnosis not present

## 2015-02-09 DIAGNOSIS — E785 Hyperlipidemia, unspecified: Secondary | ICD-10-CM | POA: Diagnosis not present

## 2015-02-09 DIAGNOSIS — I10 Essential (primary) hypertension: Secondary | ICD-10-CM | POA: Diagnosis not present

## 2015-02-09 DIAGNOSIS — I2510 Atherosclerotic heart disease of native coronary artery without angina pectoris: Secondary | ICD-10-CM | POA: Diagnosis not present

## 2015-02-09 DIAGNOSIS — Z79899 Other long term (current) drug therapy: Secondary | ICD-10-CM | POA: Diagnosis not present

## 2015-02-09 DIAGNOSIS — Z23 Encounter for immunization: Secondary | ICD-10-CM | POA: Diagnosis not present

## 2015-02-23 DIAGNOSIS — I495 Sick sinus syndrome: Secondary | ICD-10-CM | POA: Diagnosis not present

## 2015-02-23 DIAGNOSIS — Z45018 Encounter for adjustment and management of other part of cardiac pacemaker: Secondary | ICD-10-CM | POA: Diagnosis not present

## 2015-02-23 DIAGNOSIS — I480 Paroxysmal atrial fibrillation: Secondary | ICD-10-CM | POA: Diagnosis not present

## 2015-02-23 DIAGNOSIS — I2510 Atherosclerotic heart disease of native coronary artery without angina pectoris: Secondary | ICD-10-CM | POA: Diagnosis not present

## 2015-02-23 DIAGNOSIS — Z950 Presence of cardiac pacemaker: Secondary | ICD-10-CM | POA: Diagnosis not present

## 2015-02-23 DIAGNOSIS — I10 Essential (primary) hypertension: Secondary | ICD-10-CM | POA: Diagnosis not present

## 2015-03-08 DIAGNOSIS — N3010 Interstitial cystitis (chronic) without hematuria: Secondary | ICD-10-CM | POA: Diagnosis not present

## 2015-03-08 DIAGNOSIS — I2510 Atherosclerotic heart disease of native coronary artery without angina pectoris: Secondary | ICD-10-CM | POA: Diagnosis not present

## 2015-03-23 DIAGNOSIS — M25559 Pain in unspecified hip: Secondary | ICD-10-CM | POA: Diagnosis not present

## 2015-03-23 DIAGNOSIS — F419 Anxiety disorder, unspecified: Secondary | ICD-10-CM | POA: Diagnosis not present

## 2015-03-23 DIAGNOSIS — R0600 Dyspnea, unspecified: Secondary | ICD-10-CM | POA: Diagnosis not present

## 2015-03-23 DIAGNOSIS — R55 Syncope and collapse: Secondary | ICD-10-CM | POA: Diagnosis not present

## 2015-03-23 DIAGNOSIS — M810 Age-related osteoporosis without current pathological fracture: Secondary | ICD-10-CM | POA: Diagnosis not present

## 2015-03-23 DIAGNOSIS — E785 Hyperlipidemia, unspecified: Secondary | ICD-10-CM | POA: Diagnosis not present

## 2015-03-23 DIAGNOSIS — R443 Hallucinations, unspecified: Secondary | ICD-10-CM | POA: Diagnosis not present

## 2015-03-23 DIAGNOSIS — R51 Headache: Secondary | ICD-10-CM | POA: Diagnosis not present

## 2015-03-23 DIAGNOSIS — I10 Essential (primary) hypertension: Secondary | ICD-10-CM | POA: Diagnosis not present

## 2015-03-23 DIAGNOSIS — Z7982 Long term (current) use of aspirin: Secondary | ICD-10-CM | POA: Diagnosis not present

## 2015-03-23 DIAGNOSIS — M5136 Other intervertebral disc degeneration, lumbar region: Secondary | ICD-10-CM | POA: Diagnosis not present

## 2015-03-23 DIAGNOSIS — Z79899 Other long term (current) drug therapy: Secondary | ICD-10-CM | POA: Diagnosis not present

## 2015-03-23 DIAGNOSIS — Z888 Allergy status to other drugs, medicaments and biological substances status: Secondary | ICD-10-CM | POA: Diagnosis not present

## 2015-03-23 DIAGNOSIS — Z885 Allergy status to narcotic agent status: Secondary | ICD-10-CM | POA: Diagnosis not present

## 2015-03-23 DIAGNOSIS — M25562 Pain in left knee: Secondary | ICD-10-CM | POA: Diagnosis not present

## 2015-03-23 DIAGNOSIS — I2510 Atherosclerotic heart disease of native coronary artery without angina pectoris: Secondary | ICD-10-CM | POA: Diagnosis not present

## 2015-03-23 DIAGNOSIS — Z87440 Personal history of urinary (tract) infections: Secondary | ICD-10-CM | POA: Diagnosis not present

## 2015-03-23 DIAGNOSIS — K5790 Diverticulosis of intestine, part unspecified, without perforation or abscess without bleeding: Secondary | ICD-10-CM | POA: Diagnosis not present

## 2015-03-23 DIAGNOSIS — Z882 Allergy status to sulfonamides status: Secondary | ICD-10-CM | POA: Diagnosis not present

## 2015-03-23 DIAGNOSIS — M47816 Spondylosis without myelopathy or radiculopathy, lumbar region: Secondary | ICD-10-CM | POA: Diagnosis not present

## 2015-03-23 DIAGNOSIS — Z96641 Presence of right artificial hip joint: Secondary | ICD-10-CM | POA: Diagnosis not present

## 2015-03-23 DIAGNOSIS — G629 Polyneuropathy, unspecified: Secondary | ICD-10-CM | POA: Diagnosis not present

## 2015-03-23 DIAGNOSIS — M439 Deforming dorsopathy, unspecified: Secondary | ICD-10-CM | POA: Diagnosis not present

## 2015-03-23 DIAGNOSIS — M545 Low back pain: Secondary | ICD-10-CM | POA: Diagnosis not present

## 2015-03-23 DIAGNOSIS — M25511 Pain in right shoulder: Secondary | ICD-10-CM | POA: Diagnosis not present

## 2015-03-23 DIAGNOSIS — M25551 Pain in right hip: Secondary | ICD-10-CM | POA: Diagnosis not present

## 2015-03-23 DIAGNOSIS — K760 Fatty (change of) liver, not elsewhere classified: Secondary | ICD-10-CM | POA: Diagnosis not present

## 2015-03-23 DIAGNOSIS — Z853 Personal history of malignant neoplasm of breast: Secondary | ICD-10-CM | POA: Diagnosis not present

## 2015-03-23 DIAGNOSIS — G479 Sleep disorder, unspecified: Secondary | ICD-10-CM | POA: Diagnosis not present

## 2015-03-26 DIAGNOSIS — M25551 Pain in right hip: Secondary | ICD-10-CM | POA: Diagnosis not present

## 2015-04-05 ENCOUNTER — Emergency Department (INDEPENDENT_AMBULATORY_CARE_PROVIDER_SITE_OTHER): Payer: Medicare HMO

## 2015-04-05 ENCOUNTER — Emergency Department (INDEPENDENT_AMBULATORY_CARE_PROVIDER_SITE_OTHER)
Admission: EM | Admit: 2015-04-05 | Discharge: 2015-04-05 | Disposition: A | Discharge status: Home / Self Care | Payer: Medicare Other | Source: Home / Self Care | Attending: Family Medicine | Admitting: Family Medicine

## 2015-04-05 ENCOUNTER — Ambulatory Visit (INDEPENDENT_AMBULATORY_CARE_PROVIDER_SITE_OTHER): Payer: Medicare Other | Admitting: Family Medicine

## 2015-04-05 ENCOUNTER — Encounter: Payer: Self-pay | Admitting: *Deleted

## 2015-04-05 DIAGNOSIS — S6992XA Unspecified injury of left wrist, hand and finger(s), initial encounter: Secondary | ICD-10-CM | POA: Diagnosis not present

## 2015-04-05 DIAGNOSIS — X58XXXA Exposure to other specified factors, initial encounter: Secondary | ICD-10-CM

## 2015-04-05 DIAGNOSIS — S52502A Unspecified fracture of the lower end of left radius, initial encounter for closed fracture: Secondary | ICD-10-CM

## 2015-04-05 DIAGNOSIS — S52592A Other fractures of lower end of left radius, initial encounter for closed fracture: Secondary | ICD-10-CM | POA: Diagnosis not present

## 2015-04-05 DIAGNOSIS — T148 Other injury of unspecified body region: Secondary | ICD-10-CM | POA: Diagnosis not present

## 2015-04-05 DIAGNOSIS — S60512A Abrasion of left hand, initial encounter: Secondary | ICD-10-CM

## 2015-04-05 DIAGNOSIS — Z23 Encounter for immunization: Secondary | ICD-10-CM | POA: Diagnosis not present

## 2015-04-05 DIAGNOSIS — R300 Dysuria: Secondary | ICD-10-CM

## 2015-04-05 DIAGNOSIS — L089 Local infection of the skin and subcutaneous tissue, unspecified: Secondary | ICD-10-CM

## 2015-04-05 DIAGNOSIS — S62319A Displaced fracture of base of unspecified metacarpal bone, initial encounter for closed fracture: Secondary | ICD-10-CM

## 2015-04-05 DIAGNOSIS — T148XXA Other injury of unspecified body region, initial encounter: Secondary | ICD-10-CM | POA: Insufficient documentation

## 2015-04-05 DIAGNOSIS — S62308A Unspecified fracture of other metacarpal bone, initial encounter for closed fracture: Secondary | ICD-10-CM | POA: Insufficient documentation

## 2015-04-05 DIAGNOSIS — N3090 Cystitis, unspecified without hematuria: Secondary | ICD-10-CM

## 2015-04-05 LAB — POCT URINALYSIS DIP (MANUAL ENTRY)
BILIRUBIN UA: NEGATIVE
BILIRUBIN UA: NEGATIVE
Glucose, UA: NEGATIVE
Nitrite, UA: POSITIVE — AB
PH UA: 7 (ref 5–8)
Protein Ur, POC: 100 — AB
Spec Grav, UA: 1.02 (ref 1.005–1.03)
Urobilinogen, UA: 0.2 (ref 0–1)

## 2015-04-05 MED ORDER — TETANUS-DIPHTH-ACELL PERTUSSIS 5-2.5-18.5 LF-MCG/0.5 IM SUSP
0.5000 mL | Freq: Once | INTRAMUSCULAR | Status: AC
  Administered 2015-04-05: 0.5 mL via INTRAMUSCULAR

## 2015-04-05 MED ORDER — CEPHALEXIN 500 MG PO CAPS: 500.0000 mg | ORAL_CAPSULE | Freq: Two times a day (BID) | ORAL | Status: DC

## 2015-04-05 NOTE — Discharge Instructions (Signed)
Increase fluid intake.  Elevate arm. If symptoms become significantly worse during the night or over the weekend, proceed to the local emergency room.    Cast or Splint Care Casts and splints support injured limbs and keep bones from moving while they heal. It is important to care for your cast or splint at home.  HOME CARE INSTRUCTIONS  Keep the cast or splint uncovered during the drying period. It can take 24 to 48 hours to dry if it is made of plaster. A fiberglass cast will dry in less than 1 hour.  Do not rest the cast on anything harder than a pillow for the first 24 hours.  Do not put weight on your injured limb or apply pressure to the cast until your health care provider gives you permission.  Keep the cast or splint dry. Wet casts or splints can lose their shape and may not support the limb as well. A wet cast that has lost its shape can also create harmful pressure on your skin when it dries. Also, wet skin can become infected.  Cover the cast or splint with a plastic bag when bathing or when out in the rain or snow. If the cast is on the trunk of the body, take sponge baths until the cast is removed.  If your cast does become wet, dry it with a towel or a blow dryer on the cool setting only.  Keep your cast or splint clean. Soiled casts may be wiped with a moistened cloth.  Do not place any hard or soft foreign objects under your cast or splint, such as cotton, toilet paper, lotion, or powder.  Do not try to scratch the skin under the cast with any object. The object could get stuck inside the cast. Also, scratching could lead to an infection. If itching is a problem, use a blow dryer on a cool setting to relieve discomfort.  Do not trim or cut your cast or remove padding from inside of it.  Exercise all joints next to the injury that are not immobilized by the cast or splint. For example, if you have a long leg cast, exercise the hip joint and toes. If you have an arm cast  or splint, exercise the shoulder, elbow, thumb, and fingers.  Elevate your injured arm or leg on 1 or 2 pillows for the first 1 to 3 days to decrease swelling and pain.It is best if you can comfortably elevate your cast so it is higher than your heart. SEEK MEDICAL CARE IF:   Your cast or splint cracks.  Your cast or splint is too tight or too loose.  You have unbearable itching inside the cast.  Your cast becomes wet or develops a soft spot or area.  You have a bad smell coming from inside your cast.  You get an object stuck under your cast.  Your skin around the cast becomes red or raw.  You have new pain or worsening pain after the cast has been applied. SEEK IMMEDIATE MEDICAL CARE IF:   You have fluid leaking through the cast.  You are unable to move your fingers or toes.  You have discolored (blue or white), cool, painful, or very swollen fingers or toes beyond the cast.  You have tingling or numbness around the injured area.  You have severe pain or pressure under the cast.  You have any difficulty with your breathing or have shortness of breath.  You have chest pain.   This  information is not intended to replace advice given to you by your health care provider. Make sure you discuss any questions you have with your health care provider.   Document Released: 05/12/2000 Document Revised: 03/05/2013 Document Reviewed: 11/21/2012 Elsevier Interactive Patient Education 2016 [employer]   Urinary Tract Infection Urinary tract infections (UTIs) can develop anywhere along your urinary tract. Your urinary tract is your body's drainage system for removing wastes and extra water. Your urinary tract includes two kidneys, two ureters, a bladder, and a urethra. Your kidneys are a pair of bean-shaped organs. Each kidney is about the size of your fist. They are located below your ribs, one on each side of your spine. CAUSES Infections are caused by microbes, which are  microscopic organisms, including fungi, viruses, and bacteria. These organisms are so small that they can only be seen through a microscope. Bacteria are the microbes that most commonly cause UTIs. SYMPTOMS  Symptoms of UTIs may vary by age and gender of the patient and by the location of the infection. Symptoms in young women typically include a frequent and intense urge to urinate and a painful, burning feeling in the bladder or urethra during urination. Older women and men are more likely to be tired, shaky, and weak and have muscle aches and abdominal pain. A fever may mean the infection is in your kidneys. Other symptoms of a kidney infection include pain in your back or sides below the ribs, nausea, and vomiting. DIAGNOSIS To diagnose a UTI, your caregiver will ask you about your symptoms. Your caregiver will also ask you to provide a urine sample. The urine sample will be tested for bacteria and white blood cells. White blood cells are made by your body to help fight infection. TREATMENT  Typically, UTIs can be treated with medication. Because most UTIs are caused by a bacterial infection, they usually can be treated with the use of antibiotics. The choice of antibiotic and length of treatment depend on your symptoms and the type of bacteria causing your infection. HOME CARE INSTRUCTIONS  If you were prescribed antibiotics, take them exactly as your caregiver instructs you. Finish the medication even if you feel better after you have only taken some of the medication.  Drink enough water and fluids to keep your urine clear or pale yellow.  Avoid caffeine, tea, and carbonated beverages. They tend to irritate your bladder.  Empty your bladder often. Avoid holding urine for long periods of time.  Empty your bladder before and after sexual intercourse.  After a bowel movement, women should cleanse from front to back. Use each tissue only once. SEEK MEDICAL CARE IF:   You have back  pain.  You develop a fever.  Your symptoms do not begin to resolve within 3 days. SEEK IMMEDIATE MEDICAL CARE IF:   You have severe back pain or lower abdominal pain.  You develop chills.  You have nausea or vomiting.  You have continued burning or discomfort with urination. MAKE SURE YOU:   Understand these instructions.  Will watch your condition.  Will get help right away if you are not doing well or get worse.   This information is not intended to replace advice given to you by your health care provider. Make sure you discuss any questions you have with your health care provider.   Document Released: 02/22/2005 Document Revised: 02/03/2015 Document Reviewed: 06/23/2011 Elsevier Interactive Patient Education [employer].

## 2015-04-05 NOTE — ED Notes (Signed)
Pt c/o LT hand abrasion post fall last week. She also c/o confusion and dysuria x last night. Denies fever.

## 2015-04-05 NOTE — Assessment & Plan Note (Signed)
Patient has injured her left hand and wrist. She is not tender over the distal radius therefore reluctant to call this a distal radius fracture. She is quite tender on the ulnar aspect of her hand and I'm concerned she has an occult fracture of her fifth metacarpal. I placed into a well made ulnar gutter splint and she'll follow-up with me in a few days. At that time will recheck the wounds repeat x-rays and consider getting a CT scan. We'll place into a cast at that time.

## 2015-04-05 NOTE — ED Provider Notes (Signed)
CSN: [id number]     Arrival date & time 04/05/15  1333 History   First MD Initiated Contact with Patient 04/05/15 1445     Chief Complaint  Patient presents with  . Abrasion  . Dysuria      HPI Comments: Patient fell backwards on concrete one week ago, striking the dorsum of her left hand resulting in an abrasion.  No loss of consciousness.  She has had persistent mild pain in the dorsum of her hand, and increasing erythema around the abrasion.  Her Tdap is current. Three days ago she developed dysuria, frequency, and nocturia.  No fevers, chills, and sweats.  She states that she had a UTI about 1.5 months ago.  Patient is a 81 y.o. female presenting with hand injury and dysuria. The history is provided by the patient and a relative.  Hand Injury Location:  Wrist and hand Time since incident:  1 week Injury: yes   Mechanism of injury: fall   Fall:    Fall occurred:  Walking   Impact surface:  Product manager of impact: left hand/wrist. Wrist location:  L wrist Hand location:  L hand Pain details:    Quality:  Aching   Radiates to:  Does not radiate   Severity:  Mild   Onset quality:  Sudden   Duration:  1 week   Timing:  Constant   Progression:  Unchanged Chronicity:  New Foreign body present:  No foreign bodies Tetanus status:  Up to date Prior injury to area:  No Relieved by:  Nothing Worsened by:  Movement Associated symptoms: decreased range of motion, stiffness and swelling   Associated symptoms: no fever, no muscle weakness, no numbness and no tingling   Dysuria Pain quality:  Burning Pain severity:  Mild Onset quality:  Sudden Duration:  3 days Timing:  Constant Progression:  Worsening Chronicity:  Recurrent Recent urinary tract infections: yes   Relieved by:  None tried Worsened by:  Nothing tried Ineffective treatments:  None tried Urinary symptoms: frequent urination and hesitancy   Urinary symptoms: no discolored urine, no foul-smelling urine, no  hematuria and no bladder incontinence   Associated symptoms: no abdominal pain, no fever, no flank pain, no nausea and no vomiting   Risk factors: recurrent urinary tract infections     History reviewed. No pertinent past medical history. Past Surgical History  Procedure Laterality Date  . Breast surgery    . Back surgery    . Abdominal hysterectomy    . Cholecystectomy     History reviewed. No pertinent family history. Social History  Substance Use Topics  . Smoking status: Never Smoker   . Smokeless tobacco: None  . Alcohol Use: No   OB History    No data available     Review of Systems  Constitutional: Negative for fever.  Gastrointestinal: Negative for nausea, vomiting and abdominal pain.  Genitourinary: Positive for dysuria. Negative for flank pain.  Musculoskeletal: Positive for stiffness.  All other systems reviewed and are negative.   Allergies  Sulfa antibiotics  Home Medications   Prior to Admission medications   Medication Sig Start Date End Date Taking? Authorizing Provider  UNKNOWN TO PATIENT    Yes Historical Provider, MD  UNKNOWN TO PATIENT    Yes Historical Provider, MD  cephALEXin (KEFLEX) 500 MG capsule Take 1 capsule (500 mg total) by mouth 2 (two) times daily. 04/05/15   Kandra Nicolas, MD   Meds Ordered and Administered this Visit  Medications  Tdap (BOOSTRIX) injection 0.5 mL (0.5 mLs Intramuscular Given 04/05/15 1526)    BP 175/99 mmHg  Pulse 78  Temp(Src) 97.5 F (36.4 C) (Oral)  Resp 16  Ht 5\' 6"  (1.676 m)  Wt 157 lb (71.215 kg)  BMI 25.35 kg/m2  SpO2 96% No data found.   Physical Exam  Constitutional: She is oriented to person, place, and time. She appears well-developed and well-nourished. No distress.  HENT:  Head: Atraumatic.  Mouth/Throat: Oropharynx is clear and moist.  Eyes: Conjunctivae are normal. Pupils are equal, round, and reactive to light.  Neck: Neck supple.  Cardiovascular: Normal heart sounds.     Pulmonary/Chest: Breath sounds normal.  Abdominal: There is no tenderness.  Musculoskeletal:       Right shoulder: She exhibits tenderness, bony tenderness, swelling and pain. She exhibits normal pulse.       Hands: Diffuse mild tenderness to palpation dorsum hand and wrist.  Relatively good range of motion wrist.  Ecchymosis distal hand and distal forearm (proximal to wrist).  There is a 3cm by 1.5cm superficial abrasion on mid-dorsum.  No purulent discharge.  Mild surrounding erythema.  Distal neurovascular function is intact.  Lymphadenopathy:    She has no cervical adenopathy.  Neurological: She is alert and oriented to person, place, and time.  Skin: Skin is warm and dry.  Nursing note and vitals reviewed.   ED Course  Procedures  None    Labs Reviewed  POCT URINALYSIS DIP (MANUAL ENTRY) - Abnormal; Notable for the following:    Blood, UA moderate (*)    Protein Ur, POC =100 (*)    Nitrite, UA Positive (*)    Leukocytes, UA small (1+) (*)    All other components within normal limits  URINE CULTURE    Imaging Review Dg Wrist Complete Left  04/05/2015  CLINICAL DATA:  Left wrist and hand pain after a fall 1 week ago. Initial encounter. EXAM: LEFT WRIST - COMPLETE 3+ VIEW COMPARISON:  None. FINDINGS: There is a dorsally angulated and mildly impacted fracture of the distal radius with soft tissue swelling about the wrist. A well corticated ossicle at the ulnar styloid may reflect a remote fracture or accessory ossicle. There is no evidence of dislocation. The bones are osteopenic. No radiopaque foreign body. IMPRESSION: 1. Dorsally angulated and mildly impacted fracture of the distal radius, likely acute. 2. Remote fracture versus accessory ossicle at the ulnar styloid process. Electronically Signed   By: Logan Bores M.D.   On: 04/05/2015 15:32   Dg Hand Complete Left  04/05/2015  CLINICAL DATA:  Left hand and wrist pain since a fall 1 week ago. Initial encounter. EXAM: LEFT HAND  - COMPLETE 3+ VIEW COMPARISON:  None. FINDINGS: Soft tissues about the wrist are swollen. The patient has a dorsally angulated and mildly impacted fracture of the distal left radius. Given soft tissue swelling, the fracture is likely acute. Remote ulnar styloid fracture versus accessory ossicle of the ulna is noted. Bones are osteopenic. IMPRESSION: Mildly impacted and dorsally angulated distal radius fracture is likely acute given soft tissue swelling about the wrist on recent fall. Remote ulnar styloid fracture versus accessory ossicle. Osteopenia. Electronically Signed   By: Inge Rise M.D.   On: 04/05/2015 15:25       MDM   1. Dysuria   2. Cystitis   3. Abrasion, hand with infection, left, initial encounter   4. Fracture of metacarpal base of left hand, closed, initial encounter; possible fracture  left distal radius.    Urine culture pending.  Begin Keflex 500mg  BID which should cover both UTI and hand cellulitis Sterile bandage to abrasion Dispensed sling Referred to Dr. Lynne Leader for fracture management and follow-up  Increase fluid intake.  Elevate arm. If symptoms become significantly worse during the night or over the weekend, proceed to the local emergency room.  Followup with Dr. Georgina Snell in four days.   Kandra Nicolas, MD 04/05/15 [phone]

## 2015-04-05 NOTE — Progress Notes (Signed)
   Subjective:    I'm seeing this patient as a consultation for:  Dr Assunta Found  CC: Wrist injury  HPI: Patient fell a week ago at home laying on her right wrist. She notes pain in the ulnar aspect of her hand and skin tears. She's here today for evaluation. She denies any fevers or chills.  Past medical history, Surgical history, Family history not pertinant except as noted below, Social history, Allergies, and medications have been entered into the medical record, reviewed, and no changes needed.   Review of Systems: No headache, visual changes, nausea, vomiting, diarrhea, constipation, dizziness, abdominal pain, evers, chills, night sweats, weight loss, swollen lymph nodes, body aches, joint swelling, muscle aches, chest pain, shortness of breath, mood changes, visual or auditory hallucinations.   Objective:    General: Well Developed, well nourished, and in no acute distress.  Neuro/Psych: Alert and oriented x3, able to move all 4 extremities, sensation grossly intact. Skin: Warm and dry, no rashes noted.  Respiratory: Not using accessory muscles, speaking in full sentences, trachea midline.  Cardiovascular: Pulses palpable, no extremity edema. Abdomen: Does not appear distended. MSK: Left hand with skin tears on the dorsal aspect and ecchymosis present throughout. Tender to palpation over the ulnar side of the hand. She is minimally tender over the distal radius. Pulses capillary refill sensation are intact.   The wounds were dressed and the patient was placed into an ulnar gutter splint  No results found for this or any previous visit (from the past 24 hour(s)). Dg Wrist Complete Left  04/05/2015  CLINICAL DATA:  Left wrist and hand pain after a fall 1 week ago. Initial encounter. EXAM: LEFT WRIST - COMPLETE 3+ VIEW COMPARISON:  None. FINDINGS: There is a dorsally angulated and mildly impacted fracture of the distal radius with soft tissue swelling about the wrist. A well corticated  ossicle at the ulnar styloid may reflect a remote fracture or accessory ossicle. There is no evidence of dislocation. The bones are osteopenic. No radiopaque foreign body. IMPRESSION: 1. Dorsally angulated and mildly impacted fracture of the distal radius, likely acute. 2. Remote fracture versus accessory ossicle at the ulnar styloid process. Electronically Signed   By: Logan Bores M.D.   On: 04/05/2015 15:32   Dg Hand Complete Left  04/05/2015  CLINICAL DATA:  Left hand and wrist pain since a fall 1 week ago. Initial encounter. EXAM: LEFT HAND - COMPLETE 3+ VIEW COMPARISON:  None. FINDINGS: Soft tissues about the wrist are swollen. The patient has a dorsally angulated and mildly impacted fracture of the distal left radius. Given soft tissue swelling, the fracture is likely acute. Remote ulnar styloid fracture versus accessory ossicle of the ulna is noted. Bones are osteopenic. IMPRESSION: Mildly impacted and dorsally angulated distal radius fracture is likely acute given soft tissue swelling about the wrist on recent fall. Remote ulnar styloid fracture versus accessory ossicle. Osteopenia. Electronically Signed   By: Inge Rise M.D.   On: 04/05/2015 15:25    Impression and Recommendations:   This case required medical decision making of moderate complexity.

## 2015-04-05 NOTE — Patient Instructions (Signed)
Thank you for coming in today. Return on Friday.   Cast or Splint Care Casts and splints support injured limbs and keep bones from moving while they heal.  HOME CARE  Keep the cast or splint uncovered during the drying period.  A plaster cast can take 24 to 48 hours to dry.  A fiberglass cast will dry in less than 1 hour.  Do not rest the cast on anything harder than a pillow for 24 hours.  Do not put weight on your injured limb. Do not put pressure on the cast. Wait for your doctor's approval.  Keep the cast or splint dry.  Cover the cast or splint with a plastic bag during baths or wet weather.  If you have a cast over your chest and belly (trunk), take sponge baths until the cast is taken off.  If your cast gets wet, dry it with a towel or blow dryer. Use the cool setting on the blow dryer.  Keep your cast or splint clean. Wash a dirty cast with a damp cloth.  Do not put any objects under your cast or splint.  Do not scratch the skin under the cast with an object. If itching is a problem, use a blow dryer on a cool setting over the itchy area.  Do not trim or cut your cast.  Do not take out the padding from inside your cast.  Exercise your joints near the cast as told by your doctor.  Raise (elevate) your injured limb on 1 or 2 pillows for the first 1 to 3 days. GET HELP IF:  Your cast or splint cracks.  Your cast or splint is too tight or too loose.  You itch badly under the cast.  Your cast gets wet or has a soft spot.  You have a bad smell coming from the cast.  You get an object stuck under the cast.  Your skin around the cast becomes red or sore.  You have new or more pain after the cast is put on. GET HELP RIGHT AWAY IF:  You have fluid leaking through the cast.  You cannot move your fingers or toes.  Your fingers or toes turn blue or white or are cool, painful, or puffy (swollen).  You have tingling or lose feeling (numbness) around the injured  area.  You have bad pain or pressure under the cast.  You have trouble breathing or have shortness of breath.  You have chest pain.   This information is not intended to replace advice given to you by your health care provider. Make sure you discuss any questions you have with your health care provider.   Document Released: 09/14/2010 Document Revised: 01/15/2013 Document Reviewed: 11/21/2012 Elsevier Interactive Patient Education [employer].

## 2015-04-07 LAB — URINE CULTURE: Colony Count: >=100000

## 2015-04-08 ENCOUNTER — Telehealth: Payer: Self-pay | Admitting: Emergency Medicine

## 2015-04-09 ENCOUNTER — Ambulatory Visit (INDEPENDENT_AMBULATORY_CARE_PROVIDER_SITE_OTHER): Payer: Medicare Other | Admitting: Family Medicine

## 2015-04-09 ENCOUNTER — Ambulatory Visit (INDEPENDENT_AMBULATORY_CARE_PROVIDER_SITE_OTHER): Payer: Medicare Other

## 2015-04-09 ENCOUNTER — Encounter: Payer: Self-pay | Admitting: Family Medicine

## 2015-04-09 VITALS — BP 119/66 | HR 68 | Wt 155.0 lb

## 2015-04-09 DIAGNOSIS — S52592A Other fractures of lower end of left radius, initial encounter for closed fracture: Secondary | ICD-10-CM | POA: Diagnosis not present

## 2015-04-09 DIAGNOSIS — S6992XA Unspecified injury of left wrist, hand and finger(s), initial encounter: Secondary | ICD-10-CM

## 2015-04-09 DIAGNOSIS — N3000 Acute cystitis without hematuria: Secondary | ICD-10-CM | POA: Diagnosis not present

## 2015-04-09 DIAGNOSIS — X58XXXD Exposure to other specified factors, subsequent encounter: Secondary | ICD-10-CM

## 2015-04-09 DIAGNOSIS — S52502D Unspecified fracture of the lower end of left radius, subsequent encounter for closed fracture with routine healing: Secondary | ICD-10-CM | POA: Diagnosis not present

## 2015-04-09 DIAGNOSIS — N390 Urinary tract infection, site not specified: Secondary | ICD-10-CM | POA: Insufficient documentation

## 2015-04-09 DIAGNOSIS — S52602A Unspecified fracture of lower end of left ulna, initial encounter for closed fracture: Secondary | ICD-10-CM | POA: Diagnosis not present

## 2015-04-09 MED ORDER — PHENAZOPYRIDINE HCL 100 MG PO TABS: 100.0000 mg | ORAL_TABLET | Freq: Three times a day (TID) | ORAL | Status: AC | PRN

## 2015-04-09 NOTE — Assessment & Plan Note (Signed)
Continue Cipro. Recheck next week. Call in [place]

## 2015-04-09 NOTE — Patient Instructions (Signed)
Thank you for coming in today. Return in 1 week.   Cast or Splint Care Casts and splints support injured limbs and keep bones from moving while they heal.  HOME CARE  Keep the cast or splint uncovered during the drying period.  A plaster cast can take 24 to 48 hours to dry.  A fiberglass cast will dry in less than 1 hour.  Do not rest the cast on anything harder than a pillow for 24 hours.  Do not put weight on your injured limb. Do not put pressure on the cast. Wait for your doctor's approval.  Keep the cast or splint dry.  Cover the cast or splint with a plastic bag during baths or wet weather.  If you have a cast over your chest and belly (trunk), take sponge baths until the cast is taken off.  If your cast gets wet, dry it with a towel or blow dryer. Use the cool setting on the blow dryer.  Keep your cast or splint clean. Wash a dirty cast with a damp cloth.  Do not put any objects under your cast or splint.  Do not scratch the skin under the cast with an object. If itching is a problem, use a blow dryer on a cool setting over the itchy area.  Do not trim or cut your cast.  Do not take out the padding from inside your cast.  Exercise your joints near the cast as told by your doctor.  Raise (elevate) your injured limb on 1 or 2 pillows for the first 1 to 3 days. GET HELP IF:  Your cast or splint cracks.  Your cast or splint is too tight or too loose.  You itch badly under the cast.  Your cast gets wet or has a soft spot.  You have a bad smell coming from the cast.  You get an object stuck under the cast.  Your skin around the cast becomes red or sore.  You have new or more pain after the cast is put on. GET HELP RIGHT AWAY IF:  You have fluid leaking through the cast.  You cannot move your fingers or toes.  Your fingers or toes turn blue or white or are cool, painful, or puffy (swollen).  You have tingling or lose feeling (numbness) around the injured  area.  You have bad pain or pressure under the cast.  You have trouble breathing or have shortness of breath.  You have chest pain.   This information is not intended to replace advice given to you by your health care provider. Make sure you discuss any questions you have with your health care provider.   Document Released: 09/14/2010 Document Revised: 01/15/2013 Document Reviewed: 11/21/2012 Elsevier Interactive Patient Education [employer].

## 2015-04-09 NOTE — Assessment & Plan Note (Signed)
Distal radius fracture on x-ray. Patient is not tender over the distal radius. Unclear if this is a true finding or not. Patient was placed into a ulnar gutter hand and wrist cast and will recheck in one week.

## 2015-04-09 NOTE — Progress Notes (Signed)
Janet Contreras is a 81 y.o. female who presents to [place]: Primary Care  today for follow-up left wrist injury. Patient was seen earlier this week in urgent care for a left wrist injury. She had fallen about a week prior and injured her wrist. She had 2 skin tears on the dorsal aspect of her wrist as well as significant ecchymosis. X-rays were concerning for a possible distal radius fracture. However her area of tenderness was more at the ulnar side of her hand and less at her distal radius. She was placed into an ulnar gutter splint and her wounds were dressed. She is here today for recheck. She feels well.  Additionally she was seen in urgent care for urinary tract infection. Her urine culture grew Pseudomonas resistant to ceftriaxone and gentamicin but sensitive to ciprofloxacin and other agents. She was treated empirically with Keflex. She notes continued symptoms.  She notes that urgent care called in Cipro after urine culture results were back. She has not started taking it yet.   No past medical history on file. Past Surgical History  Procedure Laterality Date  . Breast surgery    . Back surgery    . Abdominal hysterectomy    . Cholecystectomy     Social History  Substance Use Topics  . Smoking status: Never Smoker   . Smokeless tobacco: Not on file  . Alcohol Use: No   family history is not on file.  ROS as above Medications: Current Outpatient Prescriptions  Medication Sig Dispense Refill  . phenazopyridine (PYRIDIUM) 100 MG tablet Take 1 tablet (100 mg total) by mouth 3 (three) times daily as needed for pain. 15 tablet 0  . UNKNOWN TO PATIENT     . UNKNOWN TO PATIENT      No current facility-administered medications for this visit.   Allergies  Allergen Reactions  . Sulfa Antibiotics      Exam:  BP 119/66 mmHg  Pulse 68  Wt 155 lb (70.308 kg) Gen: Well NAD HEENT: EOMI,  MMM Lungs: Normal work of breathing. CTABL Heart: RRR no  MRG Abd: NABS, Soft. Nondistended, Nontender Exts: Brisk capillary refill, warm and well perfused.   No results found for this or any previous visit (from the past 24 hour(s)). Dg Wrist Complete Left  04/09/2015  CLINICAL DATA:  Fall 1-1/2 weeks ago with lateral wrist and hand pain, subsequent encounter EXAM: LEFT WRIST - COMPLETE 3+ VIEW COMPARISON:  04/05/2015 FINDINGS: There are changes consistent with a prior ulnar styloid fracture with nonunion. Widening of the scapholunate space is noted consistent with chronic ligamentous injury. The previously seen distal radial fracture is again identified and stable. No significant soft tissue abnormality is noted no new fractures are seen. IMPRESSION: Stable distal radial fracture and chronic distal ulnar fracture. Electronically Signed   By: Inez Catalina M.D.   On: 04/09/2015 12:55   Dg Hand Complete Left  04/09/2015  CLINICAL DATA:  Fall.  Pain. EXAM: LEFT HAND - COMPLETE 3+ VIEW COMPARISON:  Comparison made to prior study of 04/09/2015 and 04/05/2015. FINDINGS: Stable changes of impacted fracture of the distal left radial metaphysis with mild dorsal angulation. : Styloid fracture. Diffuse osteopenia degenerative change. IMPRESSION: Stable changes of impacted distal radial metaphysis heel fracture with slight dorsal angulation. Old ulnar styloid fracture. No interim change from prior exam. Electronically Signed   By: [place]   On: 04/09/2015 12:54     Please see individual assessment and plan sections.

## 2015-04-16 ENCOUNTER — Encounter: Payer: Self-pay | Admitting: Family Medicine

## 2015-04-16 ENCOUNTER — Ambulatory Visit (INDEPENDENT_AMBULATORY_CARE_PROVIDER_SITE_OTHER): Payer: Medicare Other

## 2015-04-16 ENCOUNTER — Ambulatory Visit (INDEPENDENT_AMBULATORY_CARE_PROVIDER_SITE_OTHER): Payer: Medicare Other | Admitting: Family Medicine

## 2015-04-16 VITALS — BP 100/58 | HR 80 | Wt 156.0 lb

## 2015-04-16 DIAGNOSIS — X58XXXD Exposure to other specified factors, subsequent encounter: Secondary | ICD-10-CM | POA: Diagnosis not present

## 2015-04-16 DIAGNOSIS — T148 Other injury of unspecified body region: Secondary | ICD-10-CM | POA: Diagnosis not present

## 2015-04-16 DIAGNOSIS — S62647A Nondisplaced fracture of proximal phalanx of left little finger, initial encounter for closed fracture: Secondary | ICD-10-CM | POA: Diagnosis not present

## 2015-04-16 DIAGNOSIS — S62397D Other fracture of fifth metacarpal bone, left hand, subsequent encounter for fracture with routine healing: Secondary | ICD-10-CM | POA: Diagnosis not present

## 2015-04-16 DIAGNOSIS — T148XXA Other injury of unspecified body region, initial encounter: Secondary | ICD-10-CM

## 2015-04-16 DIAGNOSIS — S6992XA Unspecified injury of left wrist, hand and finger(s), initial encounter: Secondary | ICD-10-CM | POA: Diagnosis not present

## 2015-04-16 NOTE — Assessment & Plan Note (Signed)
I'm still not certain if patient ever had a distal radius fracture. It is not obvious that she has a fracture at the proximal fifth metacarpal as well. She seems to be doing pretty well. Wounds were redressed and she was placed back into a cast. Recheck in about 9 days.

## 2015-04-16 NOTE — Progress Notes (Signed)
Janet Contreras is a 81 y.o. female who presents to [place]: Primary Care  today for all of left wrist and hand injury. Patient was seen last week for follow-up distal radius fracture and possible fifth metacarpal fracture and skin tears. The wounds were dressed and she was placed into an ulnar gutter cast. She presents for follow-up today feeling well. No new issues.  Additionally patient was noted to have a urinary tract infection. She's feeling much better on ciprofloxacin.  No past medical history on file. Past Surgical History  Procedure Laterality Date  . Breast surgery    . Back surgery    . Abdominal hysterectomy    . Cholecystectomy     Social History  Substance Use Topics  . Smoking status: Never Smoker   . Smokeless tobacco: Not on file  . Alcohol Use: No   family history is not on file.  ROS as above Medications: Current Outpatient Prescriptions  Medication Sig Dispense Refill  . atenolol (TENORMIN) 25 MG tablet TAKE 1 TABLET TWICE DAILY FOR BLOOD PRESSURE  11  . cephALEXin (KEFLEX) 250 MG capsule Take one capsule (250 mg total) by mouth at bedtime.  12  . cephALEXin (KEFLEX) 500 MG capsule Take 500 mg by mouth 2 (two) times daily.  0  . ciprofloxacin (CIPRO) 250 MG tablet Take one tablet (250 mg total) by mouth 2 (two) times daily.  0  . gabapentin (NEURONTIN) 300 MG capsule TAKE ONE CAPSULE AT BEDTIME. MAY TAKE THREE TIMES DAILY AS NEEDED  6  . isosorbide mononitrate (IMDUR) 60 MG 24 hr tablet Take 60 mg by mouth daily.  6  . LORazepam (ATIVAN) 1 MG tablet TAKE ONE TABLET TWICE DAILY AS NEEDED FOR ANXIETY or sleep  2  . nitrofurantoin, macrocrystal-monohydrate, (MACROBID) 100 MG capsule Take one capsule (100 mg total) by mouth at bedtime.  7  . oxyCODONE-acetaminophen (PERCOCET/ROXICET) 5-325 MG tablet for pain  0  . phenazopyridine (PYRIDIUM) 200 MG tablet Take one tablet (200 mg total) by mouth 3 (three) times a day as needed for Pain.  0    No current facility-administered medications for this visit.   Allergies  Allergen Reactions  . Sulfa Antibiotics      Exam:  BP 100/58 mmHg  Pulse 80  Wt 156 lb (70.761 kg) Gen: Well NAD Left hand and wrist show some ecchymosis on the volar wrist. Nontender distal radius. Tender palpation proximal fifth metacarpal. Skin tears or well-appearing and healing. No erythema or exudate. Refill sensation intact distally.  The wounds are dressed with antibiotic ointment and Xeroform gauze. Patient was placed back into a wall made ulnar gutter cast.   Preliminary x-ray of hand shows no new fractures. No further displacement. Awaiting formal radiology review  No results found for this or any previous visit (from the past 24 hour(s)). No results found.   Please see individual assessment and plan sections.

## 2015-04-16 NOTE — Patient Instructions (Signed)
Thank you for coming in today. Return the Monday after thanksgiving.   Cast or Splint Care Casts and splints support injured limbs and keep bones from moving while they heal.  HOME CARE  Keep the cast or splint uncovered during the drying period.  A plaster cast can take 24 to 48 hours to dry.  A fiberglass cast will dry in less than 1 hour.  Do not rest the cast on anything harder than a pillow for 24 hours.  Do not put weight on your injured limb. Do not put pressure on the cast. Wait for your doctor's approval.  Keep the cast or splint dry.  Cover the cast or splint with a plastic bag during baths or wet weather.  If you have a cast over your chest and belly (trunk), take sponge baths until the cast is taken off.  If your cast gets wet, dry it with a towel or blow dryer. Use the cool setting on the blow dryer.  Keep your cast or splint clean. Wash a dirty cast with a damp cloth.  Do not put any objects under your cast or splint.  Do not scratch the skin under the cast with an object. If itching is a problem, use a blow dryer on a cool setting over the itchy area.  Do not trim or cut your cast.  Do not take out the padding from inside your cast.  Exercise your joints near the cast as told by your doctor.  Raise (elevate) your injured limb on 1 or 2 pillows for the first 1 to 3 days. GET HELP IF:  Your cast or splint cracks.  Your cast or splint is too tight or too loose.  You itch badly under the cast.  Your cast gets wet or has a soft spot.  You have a bad smell coming from the cast.  You get an object stuck under the cast.  Your skin around the cast becomes red or sore.  You have new or more pain after the cast is put on. GET HELP RIGHT AWAY IF:  You have fluid leaking through the cast.  You cannot move your fingers or toes.  Your fingers or toes turn blue or white or are cool, painful, or puffy (swollen).  You have tingling or lose feeling  (numbness) around the injured area.  You have bad pain or pressure under the cast.  You have trouble breathing or have shortness of breath.  You have chest pain.   This information is not intended to replace advice given to you by your health care provider. Make sure you discuss any questions you have with your health care provider.   Document Released: 09/14/2010 Document Revised: 01/15/2013 Document Reviewed: 11/21/2012 Elsevier Interactive Patient Education [employer].

## 2015-04-16 NOTE — Assessment & Plan Note (Signed)
Healing well. Continue wound changes

## 2015-04-26 ENCOUNTER — Encounter: Payer: Self-pay | Admitting: Family Medicine

## 2015-04-26 ENCOUNTER — Ambulatory Visit (INDEPENDENT_AMBULATORY_CARE_PROVIDER_SITE_OTHER): Payer: Medicare Other | Admitting: Family Medicine

## 2015-04-26 ENCOUNTER — Ambulatory Visit (INDEPENDENT_AMBULATORY_CARE_PROVIDER_SITE_OTHER): Payer: Medicare Other

## 2015-04-26 VITALS — BP 156/80 | HR 61 | Wt 156.0 lb

## 2015-04-26 DIAGNOSIS — T148XXA Other injury of unspecified body region, initial encounter: Secondary | ICD-10-CM

## 2015-04-26 DIAGNOSIS — S62317D Displaced fracture of base of fifth metacarpal bone. left hand, subsequent encounter for fracture with routine healing: Secondary | ICD-10-CM

## 2015-04-26 DIAGNOSIS — S6992XA Unspecified injury of left wrist, hand and finger(s), initial encounter: Secondary | ICD-10-CM

## 2015-04-26 DIAGNOSIS — X58XXXD Exposure to other specified factors, subsequent encounter: Secondary | ICD-10-CM | POA: Diagnosis not present

## 2015-04-26 DIAGNOSIS — T148 Other injury of unspecified body region: Secondary | ICD-10-CM

## 2015-04-26 DIAGNOSIS — S62317A Displaced fracture of base of fifth metacarpal bone. left hand, initial encounter for closed fracture: Secondary | ICD-10-CM | POA: Diagnosis not present

## 2015-04-26 NOTE — Assessment & Plan Note (Signed)
Exos cast. Recheck 2 weeks

## 2015-04-26 NOTE — Progress Notes (Signed)
Janet Contreras is a 81 y.o. female who presents to [facility]: Primary Care  today for follow-up left hand fracture. Patient fell a few weeks ago injuring her left hand. She was thought to initially have a distal radius fracture. Eventually was discovered that she had a proximal fifth metacarpal fracture and no new distal radius fracture. She's been casted with ulnar gutter splinting or casting since November 7. She notes that she is pain-free now. Additionally she had a skin tear which has been treated with intermittent dressing changes.   No past medical history on file. Past Surgical History  Procedure Laterality Date  . Breast surgery    . Back surgery    . Abdominal hysterectomy    . Cholecystectomy     Social History  Substance Use Topics  . Smoking status: Never Smoker   . Smokeless tobacco: Not on file  . Alcohol Use: No   family history is not on file.  ROS as above Medications: Current Outpatient Prescriptions  Medication Sig Dispense Refill  . atenolol (TENORMIN) 25 MG tablet TAKE 1 TABLET TWICE DAILY FOR BLOOD PRESSURE  11  . gabapentin (NEURONTIN) 300 MG capsule TAKE ONE CAPSULE AT BEDTIME. MAY TAKE THREE TIMES DAILY AS NEEDED  6  . isosorbide mononitrate (IMDUR) 60 MG 24 hr tablet Take 60 mg by mouth daily.  6  . LORazepam (ATIVAN) 1 MG tablet TAKE ONE TABLET TWICE DAILY AS NEEDED FOR ANXIETY or sleep  2  . nitrofurantoin, macrocrystal-monohydrate, (MACROBID) 100 MG capsule Take one capsule (100 mg total) by mouth at bedtime.  7  . oxyCODONE-acetaminophen (PERCOCET/ROXICET) 5-325 MG tablet for pain  0  . phenazopyridine (PYRIDIUM) 200 MG tablet Take one tablet (200 mg total) by mouth 3 (three) times a day as needed for Pain.  0   No current facility-administered medications for this visit.   Allergies  Allergen Reactions  . Sulfa Antibiotics      Exam:  BP 156/80 mmHg  Pulse 61  Wt 156 lb (70.761 kg) Gen: Well NAD Left hand and  wrist is normal-appearing with no obvious deformity. Nontender. Wounds are well healing very no skin ecchymosis or tenderness. Pulses capillary refill and sensation are intact  X-ray left hand: Preliminary x-ray shows no significant healing of the proximal fifth metacarpal fracture. No significant change of distal radius.  Patient was placed into an Exos Boxers Fracture cast.   No results found for this or any previous visit (from the past 24 hour(s)). No results found.   Please see individual assessment and plan sections.

## 2015-04-26 NOTE — Patient Instructions (Signed)
Thank you for coming in today. Return in 2 weeks.   

## 2015-04-26 NOTE — Progress Notes (Signed)
Quick Note:  Not much healing on xray ______

## 2015-04-26 NOTE — Assessment & Plan Note (Signed)
Healing nicely. Tegaderm placed over largest skin tear

## 2015-05-01 DIAGNOSIS — I2510 Atherosclerotic heart disease of native coronary artery without angina pectoris: Secondary | ICD-10-CM | POA: Diagnosis not present

## 2015-05-01 DIAGNOSIS — K5790 Diverticulosis of intestine, part unspecified, without perforation or abscess without bleeding: Secondary | ICD-10-CM | POA: Diagnosis not present

## 2015-05-01 DIAGNOSIS — E785 Hyperlipidemia, unspecified: Secondary | ICD-10-CM | POA: Diagnosis not present

## 2015-05-01 DIAGNOSIS — R002 Palpitations: Secondary | ICD-10-CM | POA: Diagnosis not present

## 2015-05-01 DIAGNOSIS — M8580 Other specified disorders of bone density and structure, unspecified site: Secondary | ICD-10-CM | POA: Diagnosis not present

## 2015-05-01 DIAGNOSIS — Z7982 Long term (current) use of aspirin: Secondary | ICD-10-CM | POA: Diagnosis not present

## 2015-05-01 DIAGNOSIS — R079 Chest pain, unspecified: Secondary | ICD-10-CM | POA: Diagnosis not present

## 2015-05-01 DIAGNOSIS — Z885 Allergy status to narcotic agent status: Secondary | ICD-10-CM | POA: Diagnosis not present

## 2015-05-01 DIAGNOSIS — I10 Essential (primary) hypertension: Secondary | ICD-10-CM | POA: Diagnosis not present

## 2015-05-01 DIAGNOSIS — M5136 Other intervertebral disc degeneration, lumbar region: Secondary | ICD-10-CM | POA: Diagnosis not present

## 2015-05-01 DIAGNOSIS — K449 Diaphragmatic hernia without obstruction or gangrene: Secondary | ICD-10-CM | POA: Diagnosis not present

## 2015-05-01 DIAGNOSIS — K760 Fatty (change of) liver, not elsewhere classified: Secondary | ICD-10-CM | POA: Diagnosis not present

## 2015-05-01 DIAGNOSIS — Z79899 Other long term (current) drug therapy: Secondary | ICD-10-CM | POA: Diagnosis not present

## 2015-05-01 DIAGNOSIS — Z882 Allergy status to sulfonamides status: Secondary | ICD-10-CM | POA: Diagnosis not present

## 2015-05-01 DIAGNOSIS — Z888 Allergy status to other drugs, medicaments and biological substances status: Secondary | ICD-10-CM | POA: Diagnosis not present

## 2015-05-01 DIAGNOSIS — G629 Polyneuropathy, unspecified: Secondary | ICD-10-CM | POA: Diagnosis not present

## 2015-05-01 DIAGNOSIS — Z950 Presence of cardiac pacemaker: Secondary | ICD-10-CM | POA: Diagnosis not present

## 2015-05-01 DIAGNOSIS — F419 Anxiety disorder, unspecified: Secondary | ICD-10-CM | POA: Diagnosis not present

## 2015-05-10 ENCOUNTER — Encounter: Payer: Self-pay | Admitting: Family Medicine

## 2015-05-10 ENCOUNTER — Ambulatory Visit (INDEPENDENT_AMBULATORY_CARE_PROVIDER_SITE_OTHER): Payer: Medicare Other

## 2015-05-10 ENCOUNTER — Ambulatory Visit (INDEPENDENT_AMBULATORY_CARE_PROVIDER_SITE_OTHER): Payer: Medicare Other | Admitting: Family Medicine

## 2015-05-10 VITALS — BP 121/70 | HR 60

## 2015-05-10 DIAGNOSIS — S6992XA Unspecified injury of left wrist, hand and finger(s), initial encounter: Secondary | ICD-10-CM | POA: Diagnosis not present

## 2015-05-10 DIAGNOSIS — S62317D Displaced fracture of base of fifth metacarpal bone. left hand, subsequent encounter for fracture with routine healing: Secondary | ICD-10-CM | POA: Diagnosis not present

## 2015-05-10 DIAGNOSIS — S62308A Unspecified fracture of other metacarpal bone, initial encounter for closed fracture: Secondary | ICD-10-CM

## 2015-05-10 DIAGNOSIS — S62317A Displaced fracture of base of fifth metacarpal bone. left hand, initial encounter for closed fracture: Secondary | ICD-10-CM | POA: Diagnosis not present

## 2015-05-10 DIAGNOSIS — X58XXXD Exposure to other specified factors, subsequent encounter: Secondary | ICD-10-CM

## 2015-05-10 NOTE — Progress Notes (Signed)
Janet Contreras is a 81 y.o. female who presents to [facility]: Primary Care today for follow-up fracture of left hand. Patient was originally seen on November 7 for wrist and hand fracture. She was casted until November 28 when she was placed into an exos boxer fracture cast. She has done well with no pain. With the cast off she notes pain in the ulnar proximal hand. Pulses capillary refill sensation intact. Skin is well-appearing with no skin breaks.   No past medical history on file. Past Surgical History  Procedure Laterality Date  . Breast surgery    . Back surgery    . Abdominal hysterectomy    . Cholecystectomy     Social History  Substance Use Topics  . Smoking status: Never Smoker   . Smokeless tobacco: Not on file  . Alcohol Use: No   family history is not on file.  ROS as above Medications: Current Outpatient Prescriptions  Medication Sig Dispense Refill  . atenolol (TENORMIN) 25 MG tablet TAKE 1 TABLET TWICE DAILY FOR BLOOD PRESSURE  11  . gabapentin (NEURONTIN) 300 MG capsule TAKE ONE CAPSULE AT BEDTIME. MAY TAKE THREE TIMES DAILY AS NEEDED  6  . isosorbide mononitrate (IMDUR) 60 MG 24 hr tablet Take 60 mg by mouth daily.  6  . LORazepam (ATIVAN) 1 MG tablet TAKE ONE TABLET TWICE DAILY AS NEEDED FOR ANXIETY or sleep  2  . nitrofurantoin, macrocrystal-monohydrate, (MACROBID) 100 MG capsule Take one capsule (100 mg total) by mouth at bedtime.  7  . oxyCODONE-acetaminophen (PERCOCET/ROXICET) 5-325 MG tablet for pain  0  . phenazopyridine (PYRIDIUM) 200 MG tablet Take one tablet (200 mg total) by mouth 3 (three) times a day as needed for Pain.  0   No current facility-administered medications for this visit.   Allergies  Allergen Reactions  . Sulfa Antibiotics      Exam:  BP 121/70 mmHg  Pulse 60 Gen: Well NAD Left hand is well-appearing. My slightly tender proximal fifth metacarpal. Skin is well-appearing. Pulses capillary refill and  sensation intact.  No results found for this or any previous visit (from the past 24 hour(s)). Dg Wrist Complete Left  05/10/2015  CLINICAL DATA:  Followup fifth metacarpal bone fracture. EXAM: LEFT WRIST - COMPLETE 3+ VIEW COMPARISON:  None FINDINGS: The bones are diffusely osteopenic. Nondisplaced fracture involving the base of the fifth metacarpal bone is again noted. This does not appear significantly changed from 04/26/2015. Old ulnar styloid fracture is noted. No new findings. IMPRESSION: 1. Similar appearance of base of fifth metacarpal bone fracture. Electronically Signed   By: Kerby Moors M.D.   On: 05/10/2015 11:07   Dg Hand Complete Left  05/10/2015  CLINICAL DATA:  Fracture.  Status post splinting.  04/26/2015 EXAM: LEFT HAND - COMPLETE 3+ VIEW COMPARISON:  None. FINDINGS: The bones have become progressively more osteopenic. The nondisplaced fracture involving the base of the fifth metacarpal bone appears less distinct. Chronic fracture involving the ulnar styloid is again noted. No new fractures identified. IMPRESSION: 1. Stable appearance of base of fifth metacarpal bone fracture. 2. Progressive osteopenia likely secondary to immobilization. Electronically Signed   By: Kerby Moors M.D.   On: 05/10/2015 11:05     Please see individual assessment and plan sections.

## 2015-05-10 NOTE — Patient Instructions (Signed)
Thank you for coming in today. Return in 3 weeks.  Wash the arm at least 1 x per week.  Make sure to dry the cast after washing.

## 2015-05-10 NOTE — Assessment & Plan Note (Signed)
Very slow to heal. Continue casting. Recheck 3 weeks.

## 2015-05-18 DIAGNOSIS — E785 Hyperlipidemia, unspecified: Secondary | ICD-10-CM | POA: Diagnosis not present

## 2015-05-18 DIAGNOSIS — S62102A Fracture of unspecified carpal bone, left wrist, initial encounter for closed fracture: Secondary | ICD-10-CM | POA: Diagnosis not present

## 2015-05-18 DIAGNOSIS — I10 Essential (primary) hypertension: Secondary | ICD-10-CM | POA: Diagnosis not present

## 2015-05-18 DIAGNOSIS — I2510 Atherosclerotic heart disease of native coronary artery without angina pectoris: Secondary | ICD-10-CM | POA: Diagnosis not present

## 2015-06-01 ENCOUNTER — Ambulatory Visit (INDEPENDENT_AMBULATORY_CARE_PROVIDER_SITE_OTHER): Payer: Medicare Other

## 2015-06-01 ENCOUNTER — Ambulatory Visit (INDEPENDENT_AMBULATORY_CARE_PROVIDER_SITE_OTHER): Payer: Medicare Other | Admitting: Family Medicine

## 2015-06-01 ENCOUNTER — Encounter: Payer: Self-pay | Admitting: Family Medicine

## 2015-06-01 VITALS — BP 137/70 | HR 60 | Ht 66.0 in | Wt 157.0 lb

## 2015-06-01 DIAGNOSIS — S52592A Other fractures of lower end of left radius, initial encounter for closed fracture: Secondary | ICD-10-CM | POA: Diagnosis not present

## 2015-06-01 DIAGNOSIS — S62307D Unspecified fracture of fifth metacarpal bone, left hand, subsequent encounter for fracture with routine healing: Secondary | ICD-10-CM | POA: Diagnosis not present

## 2015-06-01 DIAGNOSIS — S62308A Unspecified fracture of other metacarpal bone, initial encounter for closed fracture: Secondary | ICD-10-CM

## 2015-06-01 DIAGNOSIS — X58XXXD Exposure to other specified factors, subsequent encounter: Secondary | ICD-10-CM | POA: Diagnosis not present

## 2015-06-01 DIAGNOSIS — S52502D Unspecified fracture of the lower end of left radius, subsequent encounter for closed fracture with routine healing: Secondary | ICD-10-CM | POA: Diagnosis not present

## 2015-06-01 DIAGNOSIS — S62307A Unspecified fracture of fifth metacarpal bone, left hand, initial encounter for closed fracture: Secondary | ICD-10-CM | POA: Diagnosis not present

## 2015-06-01 NOTE — Patient Instructions (Signed)
Thank you for coming in today. Return in 2-3 weeks.  You can take the cast off to work the hand motion.

## 2015-06-01 NOTE — Progress Notes (Signed)
       Janet Contreras is a 82 y.o. female who presents to [place]: Primary Care today for follow-up left fifth metacarpal fracture. Patient was originally diagnosed with a fracture of her proximal left fifth metacarpal on November 7. She was treated with immobilization subsequently into Exos casting. She denies any pain. She feels well otherwise. No fevers chills nausea vomiting or diarrhea.   No past medical history on file. Past Surgical History  Procedure Laterality Date  . Breast surgery    . Back surgery    . Abdominal hysterectomy    . Cholecystectomy     Social History  Substance Use Topics  . Smoking status: Never Smoker   . Smokeless tobacco: Not on file  . Alcohol Use: No   family history is not on file.  ROS as above Medications: Current Outpatient Prescriptions  Medication Sig Dispense Refill  . atenolol (TENORMIN) 25 MG tablet TAKE 1 TABLET TWICE DAILY FOR BLOOD PRESSURE  11  . gabapentin (NEURONTIN) 300 MG capsule TAKE ONE CAPSULE AT BEDTIME. MAY TAKE THREE TIMES DAILY AS NEEDED  6  . isosorbide mononitrate (IMDUR) 60 MG 24 hr tablet Take 60 mg by mouth daily.  6  . LORazepam (ATIVAN) 1 MG tablet TAKE ONE TABLET TWICE DAILY AS NEEDED FOR ANXIETY or sleep  2  . nitrofurantoin, macrocrystal-monohydrate, (MACROBID) 100 MG capsule Take one capsule (100 mg total) by mouth at bedtime.  7  . oxyCODONE-acetaminophen (PERCOCET/ROXICET) 5-325 MG tablet for pain  0  . phenazopyridine (PYRIDIUM) 200 MG tablet Take one tablet (200 mg total) by mouth 3 (three) times a day as needed for Pain.  0   No current facility-administered medications for this visit.   Allergies  Allergen Reactions  . Sulfa Antibiotics      Exam:  BP 137/70 mmHg  Pulse 60  Ht 5\' 6"  (1.676 m)  Wt 157 lb (71.215 kg)  BMI 25.35 kg/m2  SpO2 98% Gen: Well NAD Left hand is well-appearing with no skin changes.  Nontender. Capillary refill and sensation are intact. Pulses are intact.   No results found for this or any previous visit (from the past 24 hour(s)). Dg Hand Complete Left  06/01/2015  CLINICAL DATA:  82 year old female with fifth metacarpal and distal left radius fractures in November. Subsequent encounter. EXAM: LEFT HAND - COMPLETE 3+ VIEW COMPARISON:  05/10/2015 and earlier. FINDINGS: Mildly impacted distal left radius fracture has decreased in conspicuity. Unchanged chronic ulnar styloid fracture. Decreased conspicuity also of the comminuted minimally displaced fracture at the ulnar base of the fifth metacarpal. Subtle fracture lucency persists. Underlying osteopenia. Stable joint spaces and alignment elsewhere in the left hand. IMPRESSION: 1. Partial versus near complete of the distal radius and proximal fifth metacarpal fracture since November. Stable alignment. 2. No new osseous abnormality identified in the left hand. Electronically Signed   By: Genevie Ann M.D.   On: 06/01/2015 12:17     Please see individual assessment and plan sections.

## 2015-06-01 NOTE — Assessment & Plan Note (Addendum)
Partial healing. Recheck in 2 weeks. Continue exos ulnar gutter cast.

## 2015-06-02 NOTE — Progress Notes (Signed)
Quick Note:  Xray with healing fracture ______

## 2015-06-10 DIAGNOSIS — H31012 Macula scars of posterior pole (postinflammatory) (post-traumatic), left eye: Secondary | ICD-10-CM | POA: Diagnosis not present

## 2015-06-10 DIAGNOSIS — H353221 Exudative age-related macular degeneration, left eye, with active choroidal neovascularization: Secondary | ICD-10-CM | POA: Diagnosis not present

## 2015-06-10 DIAGNOSIS — H353213 Exudative age-related macular degeneration, right eye, with inactive scar: Secondary | ICD-10-CM | POA: Diagnosis not present

## 2015-06-15 ENCOUNTER — Ambulatory Visit (INDEPENDENT_AMBULATORY_CARE_PROVIDER_SITE_OTHER): Payer: Medicare Other

## 2015-06-15 ENCOUNTER — Encounter: Payer: Self-pay | Admitting: Family Medicine

## 2015-06-15 ENCOUNTER — Ambulatory Visit (INDEPENDENT_AMBULATORY_CARE_PROVIDER_SITE_OTHER): Payer: Medicare Other | Admitting: Family Medicine

## 2015-06-15 VITALS — BP 117/63 | HR 68

## 2015-06-15 DIAGNOSIS — X58XXXD Exposure to other specified factors, subsequent encounter: Secondary | ICD-10-CM

## 2015-06-15 DIAGNOSIS — S62308A Unspecified fracture of other metacarpal bone, initial encounter for closed fracture: Secondary | ICD-10-CM | POA: Diagnosis not present

## 2015-06-15 DIAGNOSIS — S62307D Unspecified fracture of fifth metacarpal bone, left hand, subsequent encounter for fracture with routine healing: Secondary | ICD-10-CM

## 2015-06-15 DIAGNOSIS — S62317A Displaced fracture of base of fifth metacarpal bone. left hand, initial encounter for closed fracture: Secondary | ICD-10-CM | POA: Diagnosis not present

## 2015-06-15 NOTE — Assessment & Plan Note (Signed)
Doing very well. Discontinue cast. Work on hand motion. Recheck 1 month.

## 2015-06-15 NOTE — Patient Instructions (Signed)
Thank you for coming in today. Work on hand motion. Return in 1 month.  STOP the cast.

## 2015-06-15 NOTE — Progress Notes (Signed)
       Janet Contreras is a 82 y.o. female who presents to [facility]: Primary Care today for follow-up hand fracture. Patient was originally seen on November 7 for fall and fracture of her proximal fifth metacarpal and possible fracture of her distal radius. She has been casted in the interim. Was recently she's been using an Exos boxer's fracture type cast for the last several weeks. She tolerates a cast well. Her pain is improved significantly.   No past medical history on file. Past Surgical History  Procedure Laterality Date  . Breast surgery    . Back surgery    . Abdominal hysterectomy    . Cholecystectomy     Social History  Substance Use Topics  . Smoking status: Never Smoker   . Smokeless tobacco: Not on file  . Alcohol Use: No   family history is not on file.  ROS as above Medications: Current Outpatient Prescriptions  Medication Sig Dispense Refill  . atenolol (TENORMIN) 25 MG tablet TAKE 1 TABLET TWICE DAILY FOR BLOOD PRESSURE  11  . gabapentin (NEURONTIN) 300 MG capsule TAKE ONE CAPSULE AT BEDTIME. MAY TAKE THREE TIMES DAILY AS NEEDED  6  . isosorbide mononitrate (IMDUR) 60 MG 24 hr tablet Take 60 mg by mouth daily.  6  . LORazepam (ATIVAN) 1 MG tablet TAKE ONE TABLET TWICE DAILY AS NEEDED FOR ANXIETY or sleep  2  . nitrofurantoin, macrocrystal-monohydrate, (MACROBID) 100 MG capsule Take one capsule (100 mg total) by mouth at bedtime.  7  . oxyCODONE-acetaminophen (PERCOCET/ROXICET) 5-325 MG tablet for pain  0  . phenazopyridine (PYRIDIUM) 200 MG tablet Take one tablet (200 mg total) by mouth 3 (three) times a day as needed for Pain.  0   No current facility-administered medications for this visit.   Allergies  Allergen Reactions  . Sulfa Antibiotics      Exam:  BP 117/63 mmHg  Pulse 68 Gen: Well NAD Left hand is well appearing. Nontender no swelling. Significant  difficulty flexing her fourth and fifth digits beyond about 30  No results found for this or any previous visit (from the past 24 hour(s)). Dg Hand Complete Left  06/15/2015  CLINICAL DATA:  Fracture.  Follow-up. EXAM: LEFT HAND - COMPLETE 3+ VIEW COMPARISON:  06/01/2015. FINDINGS: Interval callus formation noted at the base of the left fifth metatarsal suggesting healing. Diffuse osteopenia degenerative change. IMPRESSION: Interval appearance of callus formation at the base of the left fifth metacarpal fracture consistent with healing. Deformities noted about the distal radius and ulna consistent with old fractures. Diffuse degenerative change. Electronically Signed   By: Marcello Moores  Register   On: 06/15/2015 12:11     Please see individual assessment and plan sections.

## 2015-06-17 DIAGNOSIS — R300 Dysuria: Secondary | ICD-10-CM | POA: Diagnosis not present

## 2015-07-05 DIAGNOSIS — M25561 Pain in right knee: Secondary | ICD-10-CM | POA: Diagnosis not present

## 2015-07-09 DIAGNOSIS — K449 Diaphragmatic hernia without obstruction or gangrene: Secondary | ICD-10-CM | POA: Diagnosis not present

## 2015-07-09 DIAGNOSIS — I10 Essential (primary) hypertension: Secondary | ICD-10-CM | POA: Diagnosis not present

## 2015-07-09 DIAGNOSIS — R1032 Left lower quadrant pain: Secondary | ICD-10-CM | POA: Diagnosis not present

## 2015-07-09 DIAGNOSIS — D1771 Benign lipomatous neoplasm of kidney: Secondary | ICD-10-CM | POA: Diagnosis not present

## 2015-07-09 DIAGNOSIS — R300 Dysuria: Secondary | ICD-10-CM | POA: Diagnosis not present

## 2015-07-09 DIAGNOSIS — I2510 Atherosclerotic heart disease of native coronary artery without angina pectoris: Secondary | ICD-10-CM | POA: Diagnosis not present

## 2015-07-09 DIAGNOSIS — F419 Anxiety disorder, unspecified: Secondary | ICD-10-CM | POA: Diagnosis not present

## 2015-07-09 DIAGNOSIS — Z79899 Other long term (current) drug therapy: Secondary | ICD-10-CM | POA: Diagnosis not present

## 2015-07-09 DIAGNOSIS — Z955 Presence of coronary angioplasty implant and graft: Secondary | ICD-10-CM | POA: Diagnosis not present

## 2015-07-13 ENCOUNTER — Ambulatory Visit: Payer: Medicare Other | Admitting: Family Medicine

## 2015-07-26 DIAGNOSIS — R32 Unspecified urinary incontinence: Secondary | ICD-10-CM | POA: Diagnosis not present

## 2015-07-26 DIAGNOSIS — N3000 Acute cystitis without hematuria: Secondary | ICD-10-CM | POA: Diagnosis not present

## 2015-08-09 DIAGNOSIS — Z950 Presence of cardiac pacemaker: Secondary | ICD-10-CM | POA: Diagnosis not present

## 2015-08-09 DIAGNOSIS — R001 Bradycardia, unspecified: Secondary | ICD-10-CM | POA: Diagnosis not present

## 2015-08-10 DIAGNOSIS — I10 Essential (primary) hypertension: Secondary | ICD-10-CM | POA: Diagnosis not present

## 2015-08-10 DIAGNOSIS — E785 Hyperlipidemia, unspecified: Secondary | ICD-10-CM | POA: Diagnosis not present

## 2015-08-10 DIAGNOSIS — F419 Anxiety disorder, unspecified: Secondary | ICD-10-CM | POA: Diagnosis not present

## 2015-08-10 DIAGNOSIS — I2510 Atherosclerotic heart disease of native coronary artery without angina pectoris: Secondary | ICD-10-CM | POA: Diagnosis not present

## 2015-08-17 DIAGNOSIS — H353221 Exudative age-related macular degeneration, left eye, with active choroidal neovascularization: Secondary | ICD-10-CM | POA: Diagnosis not present

## 2015-08-17 DIAGNOSIS — H5411 Blindness, right eye, low vision left eye: Secondary | ICD-10-CM | POA: Diagnosis not present

## 2015-08-17 DIAGNOSIS — H5372 Impaired contrast sensitivity: Secondary | ICD-10-CM | POA: Diagnosis not present

## 2015-08-17 DIAGNOSIS — H353210 Exudative age-related macular degeneration, right eye, stage unspecified: Secondary | ICD-10-CM | POA: Diagnosis not present

## 2015-08-23 DIAGNOSIS — H5372 Impaired contrast sensitivity: Secondary | ICD-10-CM | POA: Diagnosis not present

## 2015-08-23 DIAGNOSIS — H353210 Exudative age-related macular degeneration, right eye, stage unspecified: Secondary | ICD-10-CM | POA: Diagnosis not present

## 2015-08-23 DIAGNOSIS — H5411 Blindness, right eye, low vision left eye: Secondary | ICD-10-CM | POA: Diagnosis not present

## 2015-08-23 DIAGNOSIS — H353221 Exudative age-related macular degeneration, left eye, with active choroidal neovascularization: Secondary | ICD-10-CM | POA: Diagnosis not present

## 2015-08-31 DIAGNOSIS — Z780 Asymptomatic menopausal state: Secondary | ICD-10-CM | POA: Diagnosis not present

## 2015-08-31 DIAGNOSIS — I10 Essential (primary) hypertension: Secondary | ICD-10-CM | POA: Diagnosis not present

## 2015-08-31 DIAGNOSIS — E785 Hyperlipidemia, unspecified: Secondary | ICD-10-CM | POA: Diagnosis not present

## 2015-09-01 DIAGNOSIS — H353210 Exudative age-related macular degeneration, right eye, stage unspecified: Secondary | ICD-10-CM | POA: Diagnosis not present

## 2015-09-01 DIAGNOSIS — H5372 Impaired contrast sensitivity: Secondary | ICD-10-CM | POA: Diagnosis not present

## 2015-09-01 DIAGNOSIS — H5411 Blindness, right eye, low vision left eye: Secondary | ICD-10-CM | POA: Diagnosis not present

## 2015-09-01 DIAGNOSIS — H353221 Exudative age-related macular degeneration, left eye, with active choroidal neovascularization: Secondary | ICD-10-CM | POA: Diagnosis not present

## 2015-09-09 DIAGNOSIS — Z780 Asymptomatic menopausal state: Secondary | ICD-10-CM | POA: Diagnosis not present

## 2015-09-09 DIAGNOSIS — M899 Disorder of bone, unspecified: Secondary | ICD-10-CM | POA: Diagnosis not present

## 2015-09-15 DIAGNOSIS — H353210 Exudative age-related macular degeneration, right eye, stage unspecified: Secondary | ICD-10-CM | POA: Diagnosis not present

## 2015-09-15 DIAGNOSIS — H5372 Impaired contrast sensitivity: Secondary | ICD-10-CM | POA: Diagnosis not present

## 2015-09-15 DIAGNOSIS — H353221 Exudative age-related macular degeneration, left eye, with active choroidal neovascularization: Secondary | ICD-10-CM | POA: Diagnosis not present

## 2015-09-15 DIAGNOSIS — H5411 Blindness, right eye, low vision left eye: Secondary | ICD-10-CM | POA: Diagnosis not present

## 2015-09-21 DIAGNOSIS — H5372 Impaired contrast sensitivity: Secondary | ICD-10-CM | POA: Diagnosis not present

## 2015-09-21 DIAGNOSIS — H353210 Exudative age-related macular degeneration, right eye, stage unspecified: Secondary | ICD-10-CM | POA: Diagnosis not present

## 2015-09-21 DIAGNOSIS — H5411 Blindness, right eye, low vision left eye: Secondary | ICD-10-CM | POA: Diagnosis not present

## 2015-09-21 DIAGNOSIS — H353221 Exudative age-related macular degeneration, left eye, with active choroidal neovascularization: Secondary | ICD-10-CM | POA: Diagnosis not present

## 2015-09-22 DIAGNOSIS — Z950 Presence of cardiac pacemaker: Secondary | ICD-10-CM | POA: Diagnosis not present

## 2015-09-22 DIAGNOSIS — R001 Bradycardia, unspecified: Secondary | ICD-10-CM | POA: Diagnosis not present

## 2015-09-30 DIAGNOSIS — H353221 Exudative age-related macular degeneration, left eye, with active choroidal neovascularization: Secondary | ICD-10-CM | POA: Diagnosis not present

## 2015-09-30 DIAGNOSIS — H353210 Exudative age-related macular degeneration, right eye, stage unspecified: Secondary | ICD-10-CM | POA: Diagnosis not present

## 2015-09-30 DIAGNOSIS — H5372 Impaired contrast sensitivity: Secondary | ICD-10-CM | POA: Diagnosis not present

## 2015-09-30 DIAGNOSIS — H5411 Blindness, right eye, low vision left eye: Secondary | ICD-10-CM | POA: Diagnosis not present

## 2015-10-12 DIAGNOSIS — R32 Unspecified urinary incontinence: Secondary | ICD-10-CM | POA: Diagnosis not present

## 2015-10-12 DIAGNOSIS — Z87440 Personal history of urinary (tract) infections: Secondary | ICD-10-CM | POA: Diagnosis not present

## 2015-10-12 DIAGNOSIS — R300 Dysuria: Secondary | ICD-10-CM | POA: Diagnosis not present

## 2015-10-15 DIAGNOSIS — M25561 Pain in right knee: Secondary | ICD-10-CM | POA: Diagnosis not present

## 2015-10-20 DIAGNOSIS — Z8679 Personal history of other diseases of the circulatory system: Secondary | ICD-10-CM | POA: Diagnosis not present

## 2015-10-20 DIAGNOSIS — R001 Bradycardia, unspecified: Secondary | ICD-10-CM | POA: Diagnosis not present

## 2015-10-20 DIAGNOSIS — Z950 Presence of cardiac pacemaker: Secondary | ICD-10-CM | POA: Diagnosis not present

## 2015-10-21 DIAGNOSIS — H353221 Exudative age-related macular degeneration, left eye, with active choroidal neovascularization: Secondary | ICD-10-CM | POA: Diagnosis not present

## 2015-10-21 DIAGNOSIS — H353213 Exudative age-related macular degeneration, right eye, with inactive scar: Secondary | ICD-10-CM | POA: Diagnosis not present

## 2015-10-27 DIAGNOSIS — Z79899 Other long term (current) drug therapy: Secondary | ICD-10-CM | POA: Diagnosis not present

## 2015-10-27 DIAGNOSIS — G629 Polyneuropathy, unspecified: Secondary | ICD-10-CM | POA: Diagnosis not present

## 2015-10-27 DIAGNOSIS — I160 Hypertensive urgency: Secondary | ICD-10-CM | POA: Diagnosis not present

## 2015-10-27 DIAGNOSIS — R079 Chest pain, unspecified: Secondary | ICD-10-CM | POA: Diagnosis not present

## 2015-10-27 DIAGNOSIS — Z888 Allergy status to other drugs, medicaments and biological substances status: Secondary | ICD-10-CM | POA: Diagnosis not present

## 2015-10-27 DIAGNOSIS — R072 Precordial pain: Secondary | ICD-10-CM | POA: Diagnosis not present

## 2015-10-27 DIAGNOSIS — Z955 Presence of coronary angioplasty implant and graft: Secondary | ICD-10-CM | POA: Diagnosis not present

## 2015-10-27 DIAGNOSIS — Z882 Allergy status to sulfonamides status: Secondary | ICD-10-CM | POA: Diagnosis not present

## 2015-10-27 DIAGNOSIS — Z853 Personal history of malignant neoplasm of breast: Secondary | ICD-10-CM | POA: Diagnosis not present

## 2015-10-27 DIAGNOSIS — Z7982 Long term (current) use of aspirin: Secondary | ICD-10-CM | POA: Diagnosis not present

## 2015-10-27 DIAGNOSIS — I2510 Atherosclerotic heart disease of native coronary artery without angina pectoris: Secondary | ICD-10-CM | POA: Diagnosis not present

## 2015-10-27 DIAGNOSIS — Z801 Family history of malignant neoplasm of trachea, bronchus and lung: Secondary | ICD-10-CM | POA: Diagnosis not present

## 2015-10-27 DIAGNOSIS — Z8249 Family history of ischemic heart disease and other diseases of the circulatory system: Secondary | ICD-10-CM | POA: Diagnosis not present

## 2015-10-27 DIAGNOSIS — Z885 Allergy status to narcotic agent status: Secondary | ICD-10-CM | POA: Diagnosis not present

## 2015-10-27 DIAGNOSIS — R1013 Epigastric pain: Secondary | ICD-10-CM | POA: Diagnosis not present

## 2015-10-27 DIAGNOSIS — E785 Hyperlipidemia, unspecified: Secondary | ICD-10-CM | POA: Diagnosis not present

## 2015-10-27 DIAGNOSIS — R232 Flushing: Secondary | ICD-10-CM | POA: Diagnosis not present

## 2015-10-27 DIAGNOSIS — Z950 Presence of cardiac pacemaker: Secondary | ICD-10-CM | POA: Diagnosis not present

## 2015-10-27 DIAGNOSIS — F419 Anxiety disorder, unspecified: Secondary | ICD-10-CM | POA: Diagnosis not present

## 2015-10-28 DIAGNOSIS — R079 Chest pain, unspecified: Secondary | ICD-10-CM | POA: Diagnosis not present

## 2015-10-28 DIAGNOSIS — E785 Hyperlipidemia, unspecified: Secondary | ICD-10-CM | POA: Diagnosis not present

## 2015-10-28 DIAGNOSIS — Z853 Personal history of malignant neoplasm of breast: Secondary | ICD-10-CM | POA: Diagnosis not present

## 2015-10-28 DIAGNOSIS — I2510 Atherosclerotic heart disease of native coronary artery without angina pectoris: Secondary | ICD-10-CM | POA: Diagnosis not present

## 2015-10-28 DIAGNOSIS — Z950 Presence of cardiac pacemaker: Secondary | ICD-10-CM | POA: Diagnosis not present

## 2015-10-28 DIAGNOSIS — Z955 Presence of coronary angioplasty implant and graft: Secondary | ICD-10-CM | POA: Diagnosis not present

## 2015-10-28 DIAGNOSIS — I160 Hypertensive urgency: Secondary | ICD-10-CM | POA: Diagnosis not present

## 2015-10-28 DIAGNOSIS — F419 Anxiety disorder, unspecified: Secondary | ICD-10-CM | POA: Diagnosis not present

## 2015-11-02 DIAGNOSIS — I10 Essential (primary) hypertension: Secondary | ICD-10-CM | POA: Diagnosis not present

## 2015-11-02 DIAGNOSIS — E559 Vitamin D deficiency, unspecified: Secondary | ICD-10-CM | POA: Diagnosis not present

## 2015-11-02 DIAGNOSIS — E039 Hypothyroidism, unspecified: Secondary | ICD-10-CM | POA: Diagnosis not present

## 2015-11-02 DIAGNOSIS — E785 Hyperlipidemia, unspecified: Secondary | ICD-10-CM | POA: Diagnosis not present

## 2015-11-02 DIAGNOSIS — E538 Deficiency of other specified B group vitamins: Secondary | ICD-10-CM | POA: Diagnosis not present

## 2015-11-02 DIAGNOSIS — D649 Anemia, unspecified: Secondary | ICD-10-CM | POA: Diagnosis not present

## 2015-11-09 DIAGNOSIS — N952 Postmenopausal atrophic vaginitis: Secondary | ICD-10-CM | POA: Diagnosis not present

## 2015-11-09 DIAGNOSIS — R350 Frequency of micturition: Secondary | ICD-10-CM | POA: Diagnosis not present

## 2015-11-09 DIAGNOSIS — N895 Stricture and atresia of vagina: Secondary | ICD-10-CM | POA: Diagnosis not present

## 2015-11-10 DIAGNOSIS — M6281 Muscle weakness (generalized): Secondary | ICD-10-CM | POA: Diagnosis not present

## 2015-11-10 DIAGNOSIS — M25561 Pain in right knee: Secondary | ICD-10-CM | POA: Diagnosis not present

## 2015-11-17 DIAGNOSIS — G4700 Insomnia, unspecified: Secondary | ICD-10-CM | POA: Diagnosis not present

## 2015-11-17 DIAGNOSIS — F0390 Unspecified dementia without behavioral disturbance: Secondary | ICD-10-CM | POA: Diagnosis not present

## 2015-11-17 DIAGNOSIS — F419 Anxiety disorder, unspecified: Secondary | ICD-10-CM | POA: Diagnosis not present

## 2015-12-03 DIAGNOSIS — R300 Dysuria: Secondary | ICD-10-CM | POA: Diagnosis not present

## 2015-12-11 DIAGNOSIS — N390 Urinary tract infection, site not specified: Secondary | ICD-10-CM | POA: Diagnosis not present

## 2015-12-31 DIAGNOSIS — R300 Dysuria: Secondary | ICD-10-CM | POA: Diagnosis not present

## 2016-01-19 DIAGNOSIS — R5383 Other fatigue: Secondary | ICD-10-CM | POA: Diagnosis not present

## 2016-01-20 DIAGNOSIS — Z8679 Personal history of other diseases of the circulatory system: Secondary | ICD-10-CM | POA: Diagnosis not present

## 2016-01-20 DIAGNOSIS — I10 Essential (primary) hypertension: Secondary | ICD-10-CM | POA: Diagnosis not present

## 2016-01-20 DIAGNOSIS — R040 Epistaxis: Secondary | ICD-10-CM | POA: Diagnosis not present

## 2016-01-20 DIAGNOSIS — Z950 Presence of cardiac pacemaker: Secondary | ICD-10-CM | POA: Diagnosis not present

## 2016-01-20 DIAGNOSIS — I2510 Atherosclerotic heart disease of native coronary artery without angina pectoris: Secondary | ICD-10-CM | POA: Diagnosis not present

## 2016-01-20 DIAGNOSIS — R001 Bradycardia, unspecified: Secondary | ICD-10-CM | POA: Diagnosis not present

## 2016-01-20 DIAGNOSIS — I999 Unspecified disorder of circulatory system: Secondary | ICD-10-CM | POA: Diagnosis not present

## 2016-01-24 DIAGNOSIS — I2510 Atherosclerotic heart disease of native coronary artery without angina pectoris: Secondary | ICD-10-CM | POA: Diagnosis not present

## 2016-02-01 DIAGNOSIS — I10 Essential (primary) hypertension: Secondary | ICD-10-CM | POA: Diagnosis not present

## 2016-02-01 DIAGNOSIS — Z45018 Encounter for adjustment and management of other part of cardiac pacemaker: Secondary | ICD-10-CM | POA: Diagnosis not present

## 2016-02-01 DIAGNOSIS — I495 Sick sinus syndrome: Secondary | ICD-10-CM | POA: Diagnosis not present

## 2016-02-01 DIAGNOSIS — I480 Paroxysmal atrial fibrillation: Secondary | ICD-10-CM | POA: Diagnosis not present

## 2016-02-01 DIAGNOSIS — I2510 Atherosclerotic heart disease of native coronary artery without angina pectoris: Secondary | ICD-10-CM | POA: Diagnosis not present

## 2016-02-01 DIAGNOSIS — R350 Frequency of micturition: Secondary | ICD-10-CM | POA: Diagnosis not present

## 2016-02-01 DIAGNOSIS — N359 Urethral stricture, unspecified: Secondary | ICD-10-CM | POA: Diagnosis not present

## 2016-02-11 DIAGNOSIS — R509 Fever, unspecified: Secondary | ICD-10-CM | POA: Diagnosis not present

## 2016-03-01 DIAGNOSIS — Z853 Personal history of malignant neoplasm of breast: Secondary | ICD-10-CM | POA: Diagnosis not present

## 2016-03-01 DIAGNOSIS — Z885 Allergy status to narcotic agent status: Secondary | ICD-10-CM | POA: Diagnosis not present

## 2016-03-01 DIAGNOSIS — Z79899 Other long term (current) drug therapy: Secondary | ICD-10-CM | POA: Diagnosis not present

## 2016-03-01 DIAGNOSIS — Z7982 Long term (current) use of aspirin: Secondary | ICD-10-CM | POA: Diagnosis not present

## 2016-03-01 DIAGNOSIS — Z882 Allergy status to sulfonamides status: Secondary | ICD-10-CM | POA: Diagnosis not present

## 2016-03-01 DIAGNOSIS — I2510 Atherosclerotic heart disease of native coronary artery without angina pectoris: Secondary | ICD-10-CM | POA: Diagnosis not present

## 2016-03-01 DIAGNOSIS — I480 Paroxysmal atrial fibrillation: Secondary | ICD-10-CM | POA: Diagnosis not present

## 2016-03-01 DIAGNOSIS — Z888 Allergy status to other drugs, medicaments and biological substances status: Secondary | ICD-10-CM | POA: Diagnosis not present

## 2016-03-01 DIAGNOSIS — K219 Gastro-esophageal reflux disease without esophagitis: Secondary | ICD-10-CM | POA: Diagnosis not present

## 2016-03-01 DIAGNOSIS — E785 Hyperlipidemia, unspecified: Secondary | ICD-10-CM | POA: Diagnosis not present

## 2016-03-01 DIAGNOSIS — Z45010 Encounter for checking and testing of cardiac pacemaker pulse generator [battery]: Secondary | ICD-10-CM | POA: Diagnosis not present

## 2016-03-01 DIAGNOSIS — I471 Supraventricular tachycardia: Secondary | ICD-10-CM | POA: Diagnosis not present

## 2016-03-01 DIAGNOSIS — I10 Essential (primary) hypertension: Secondary | ICD-10-CM | POA: Diagnosis not present

## 2016-03-01 DIAGNOSIS — I495 Sick sinus syndrome: Secondary | ICD-10-CM | POA: Diagnosis not present

## 2016-03-09 DIAGNOSIS — R309 Painful micturition, unspecified: Secondary | ICD-10-CM | POA: Diagnosis not present

## 2016-03-09 DIAGNOSIS — R3915 Urgency of urination: Secondary | ICD-10-CM | POA: Diagnosis not present

## 2016-03-09 DIAGNOSIS — R300 Dysuria: Secondary | ICD-10-CM | POA: Diagnosis not present

## 2016-03-10 DIAGNOSIS — I2510 Atherosclerotic heart disease of native coronary artery without angina pectoris: Secondary | ICD-10-CM | POA: Diagnosis not present

## 2016-03-10 DIAGNOSIS — N390 Urinary tract infection, site not specified: Secondary | ICD-10-CM | POA: Diagnosis not present

## 2016-03-10 DIAGNOSIS — I10 Essential (primary) hypertension: Secondary | ICD-10-CM | POA: Diagnosis not present

## 2016-03-10 DIAGNOSIS — E785 Hyperlipidemia, unspecified: Secondary | ICD-10-CM | POA: Diagnosis not present

## 2016-03-16 DIAGNOSIS — N359 Urethral stricture, unspecified: Secondary | ICD-10-CM | POA: Diagnosis not present

## 2016-03-16 DIAGNOSIS — F419 Anxiety disorder, unspecified: Secondary | ICD-10-CM | POA: Diagnosis not present

## 2016-03-16 DIAGNOSIS — F0390 Unspecified dementia without behavioral disturbance: Secondary | ICD-10-CM | POA: Diagnosis not present

## 2016-04-07 IMAGING — CR DG WRIST COMPLETE 3+V*L*
3 series · 3 of 3 positions shown · non-contrast
Comparison: None

CLINICAL DATA: Followup fifth metacarpal bone fracture.

EXAM:
LEFT WRIST - COMPLETE 3+ VIEW

[wrist pa]
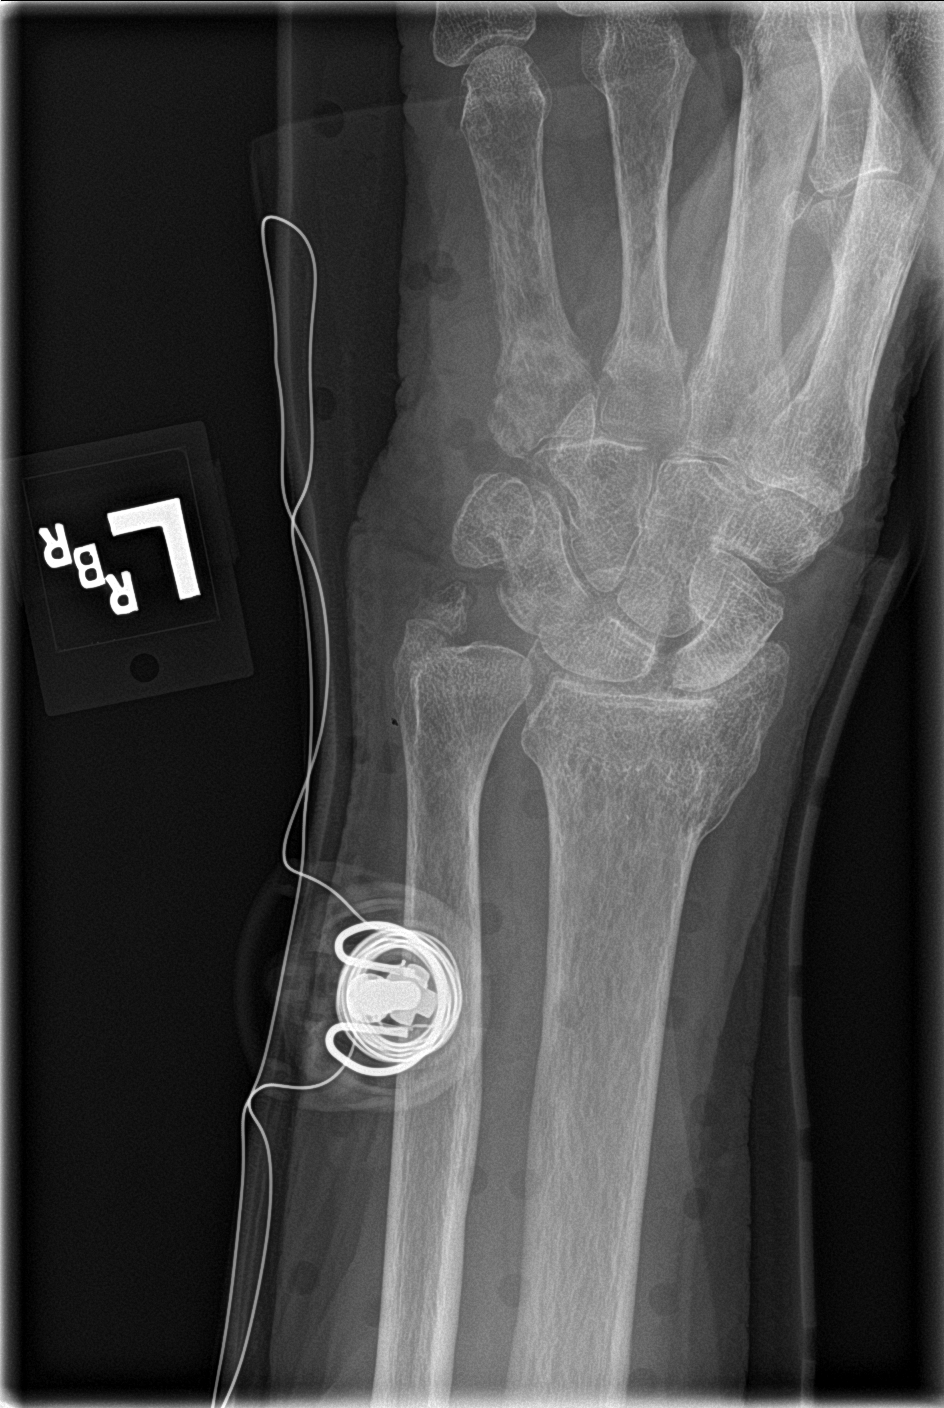

[wrist obl]
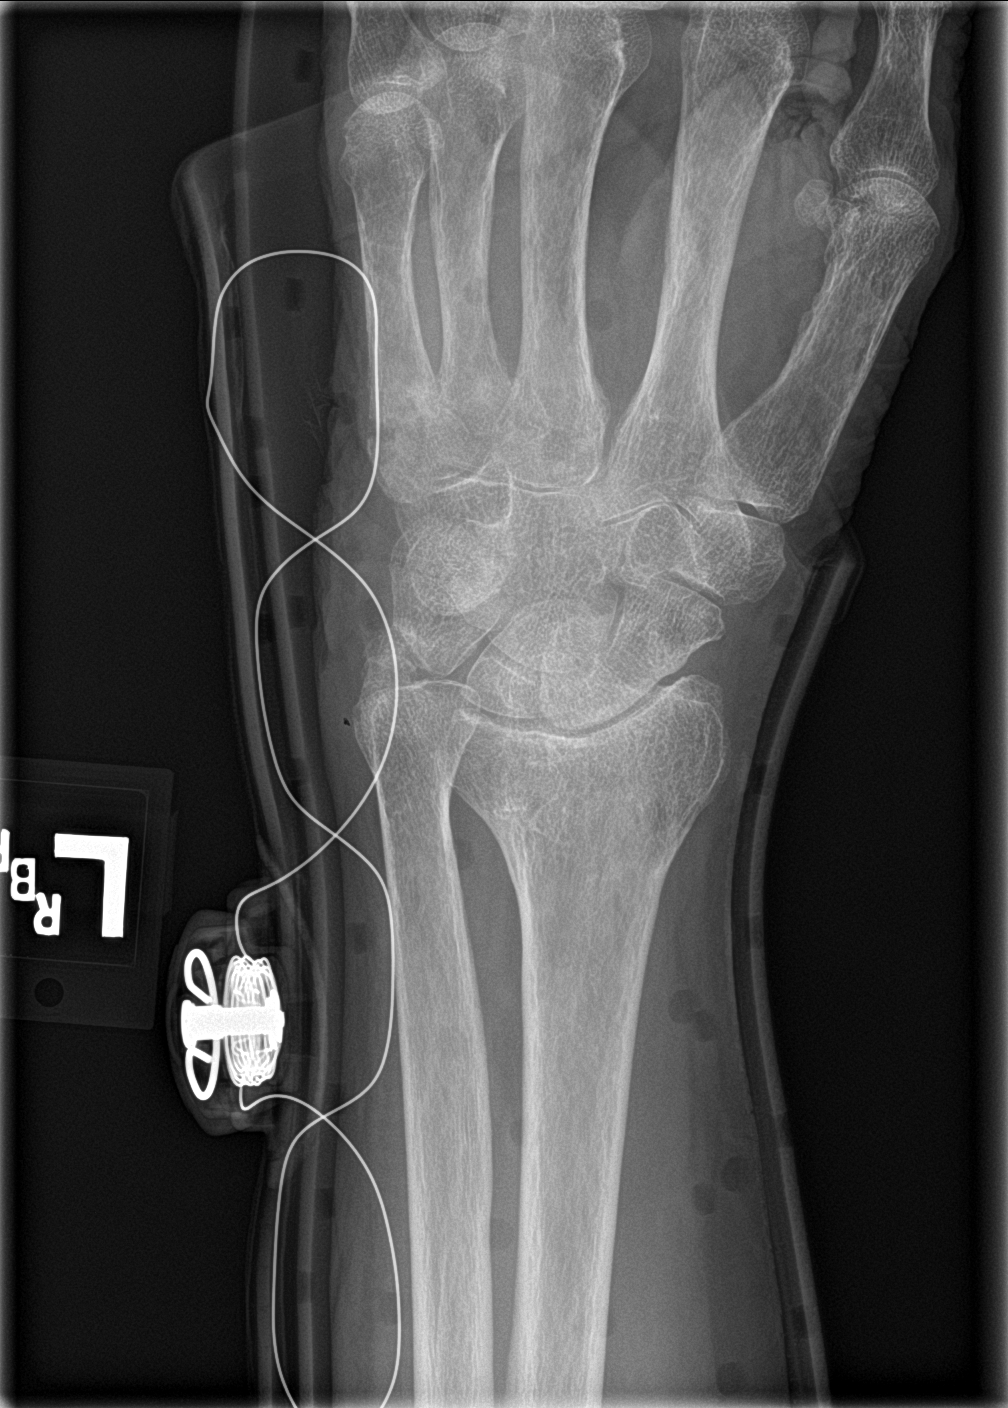

[wrist lat]
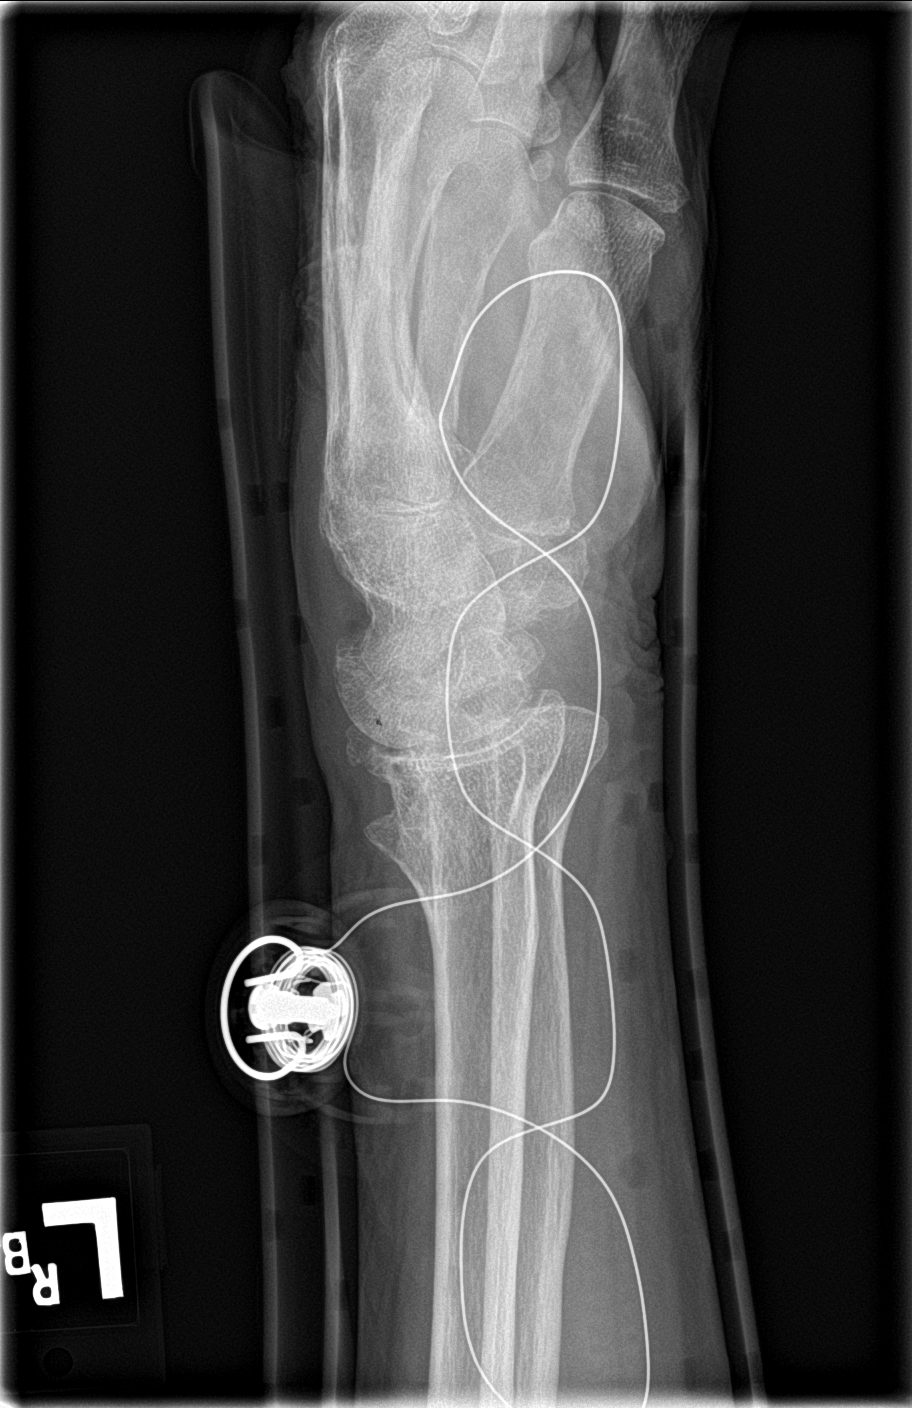

[3 of 3 positions shown; findings below may reference images not displayed]

FINDINGS: The bones are diffusely osteopenic. Nondisplaced fracture involving
the base of the fifth metacarpal bone is again noted. This does not
appear significantly changed from 04/26/2015. Old ulnar styloid
fracture is noted. No new findings.
IMPRESSION: 1. Similar appearance of base of fifth metacarpal bone fracture.

## 2016-04-07 IMAGING — CR DG HAND COMPLETE 3+V*L*
3 series · 3 of 3 positions shown · non-contrast
Comparison: None.

CLINICAL DATA: Fracture.  Status post splinting.  04/26/2015

EXAM:
LEFT HAND - COMPLETE 3+ VIEW

[hand pa]
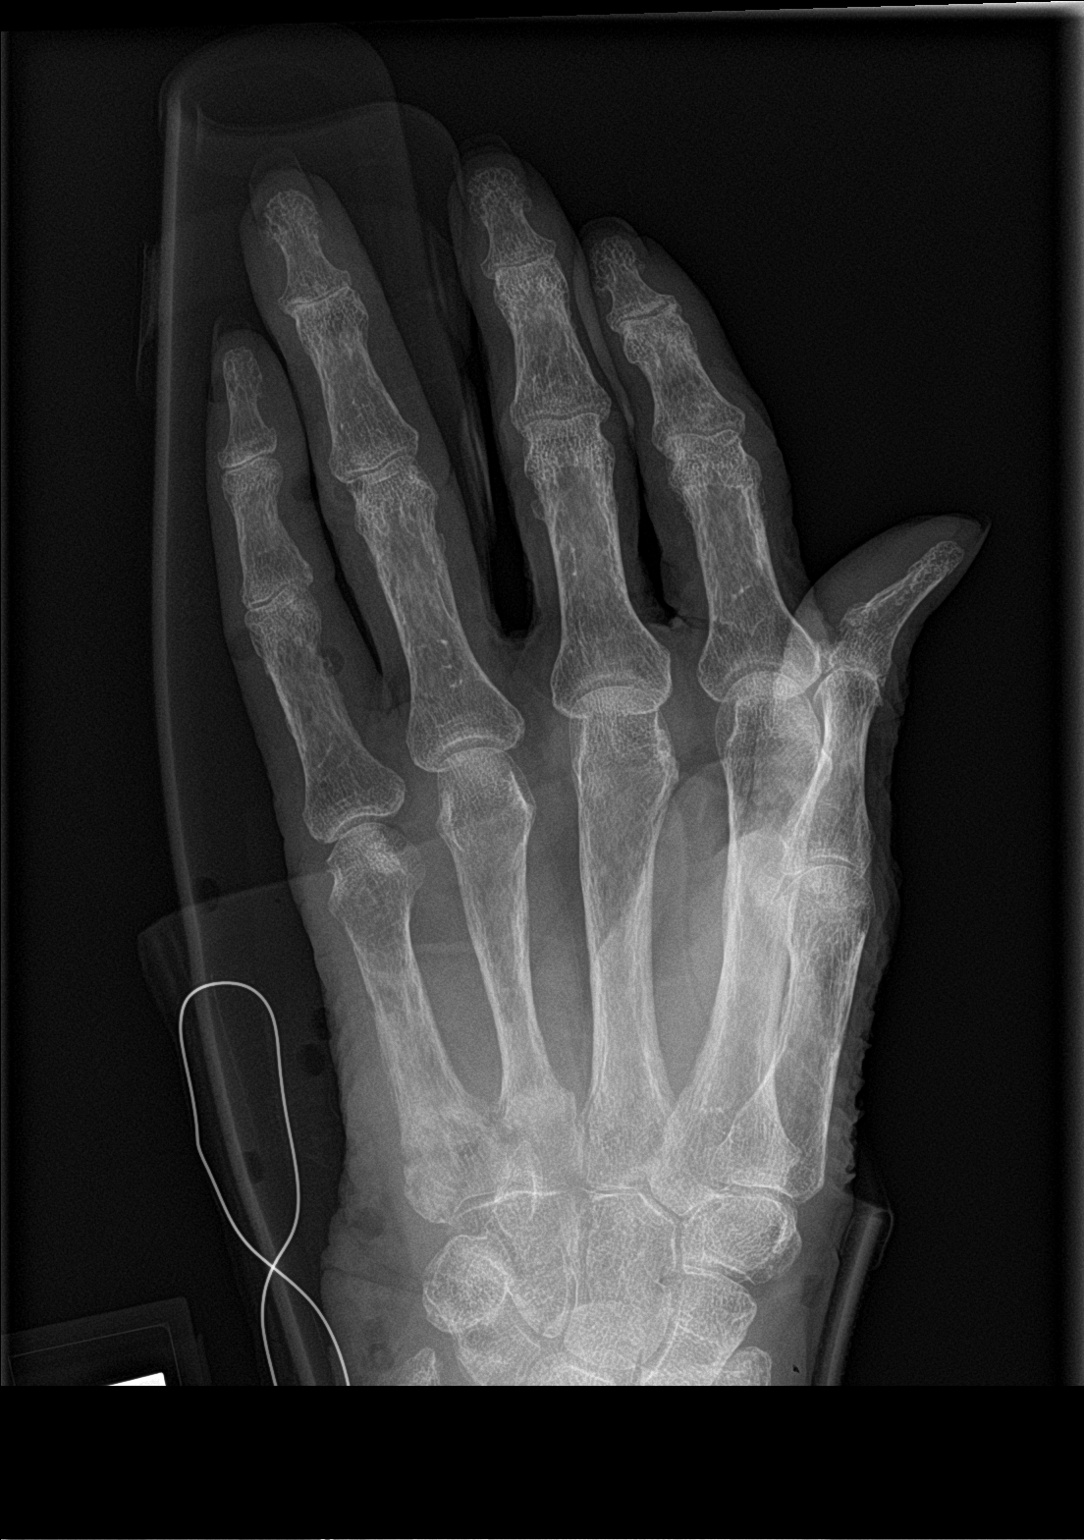

[hand obl]
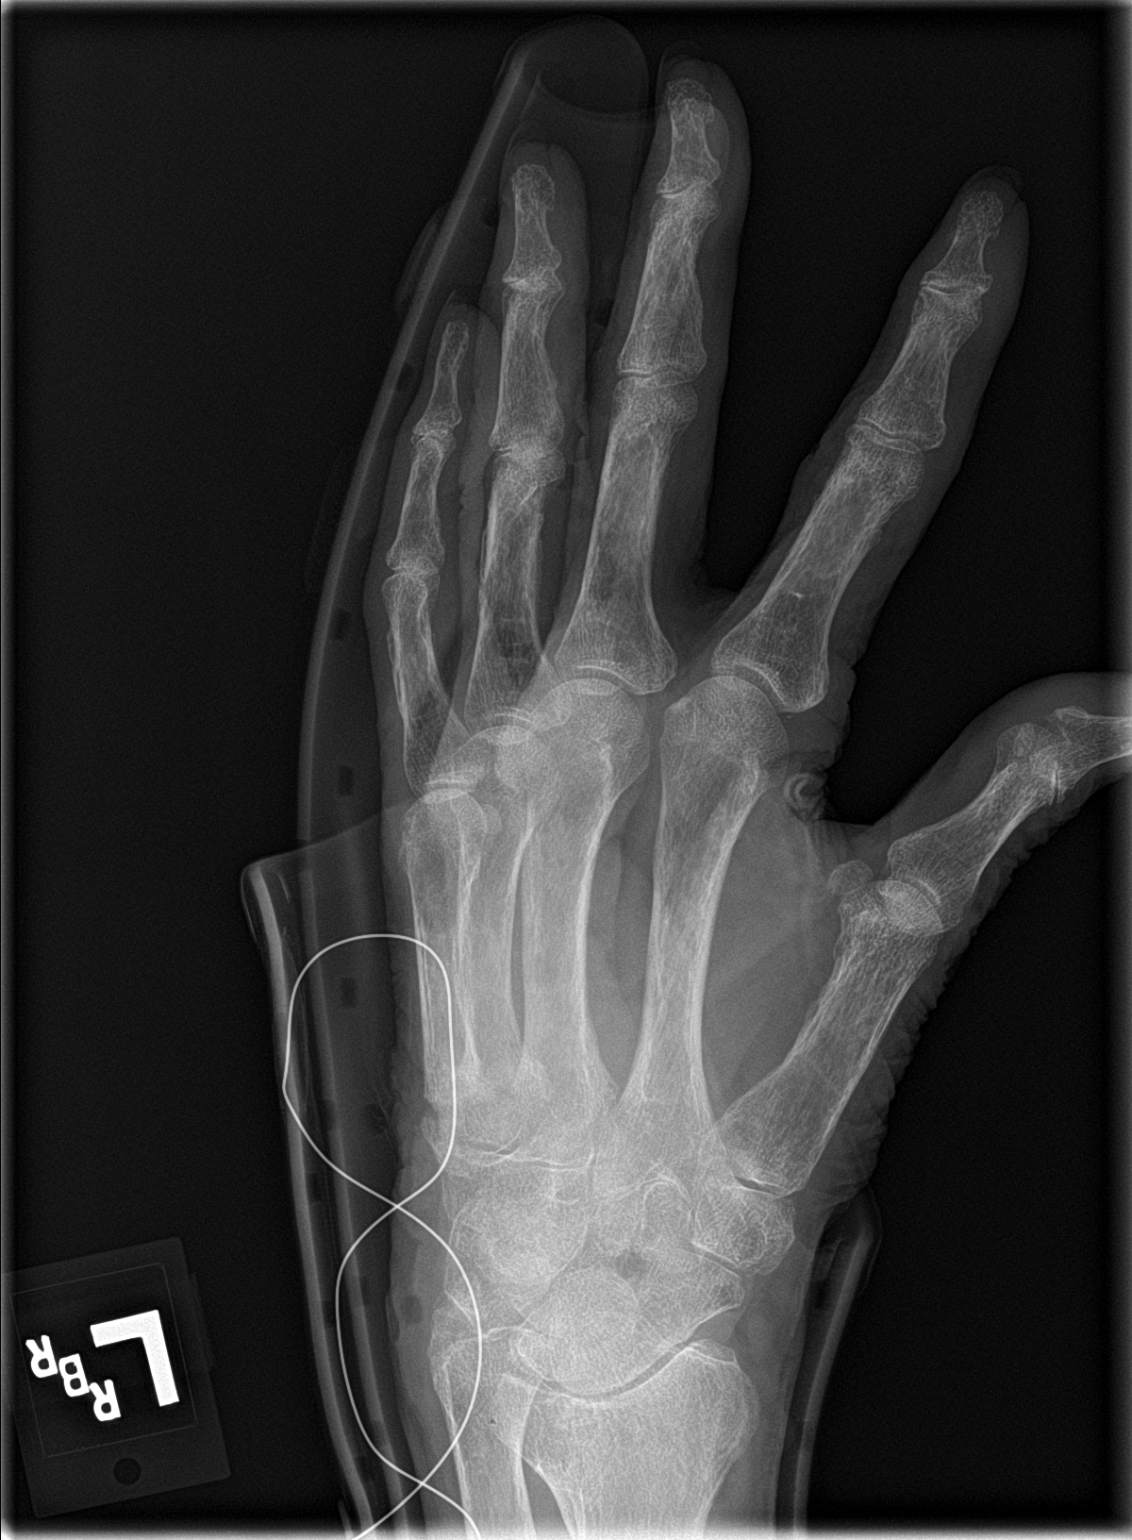

[hand lat]
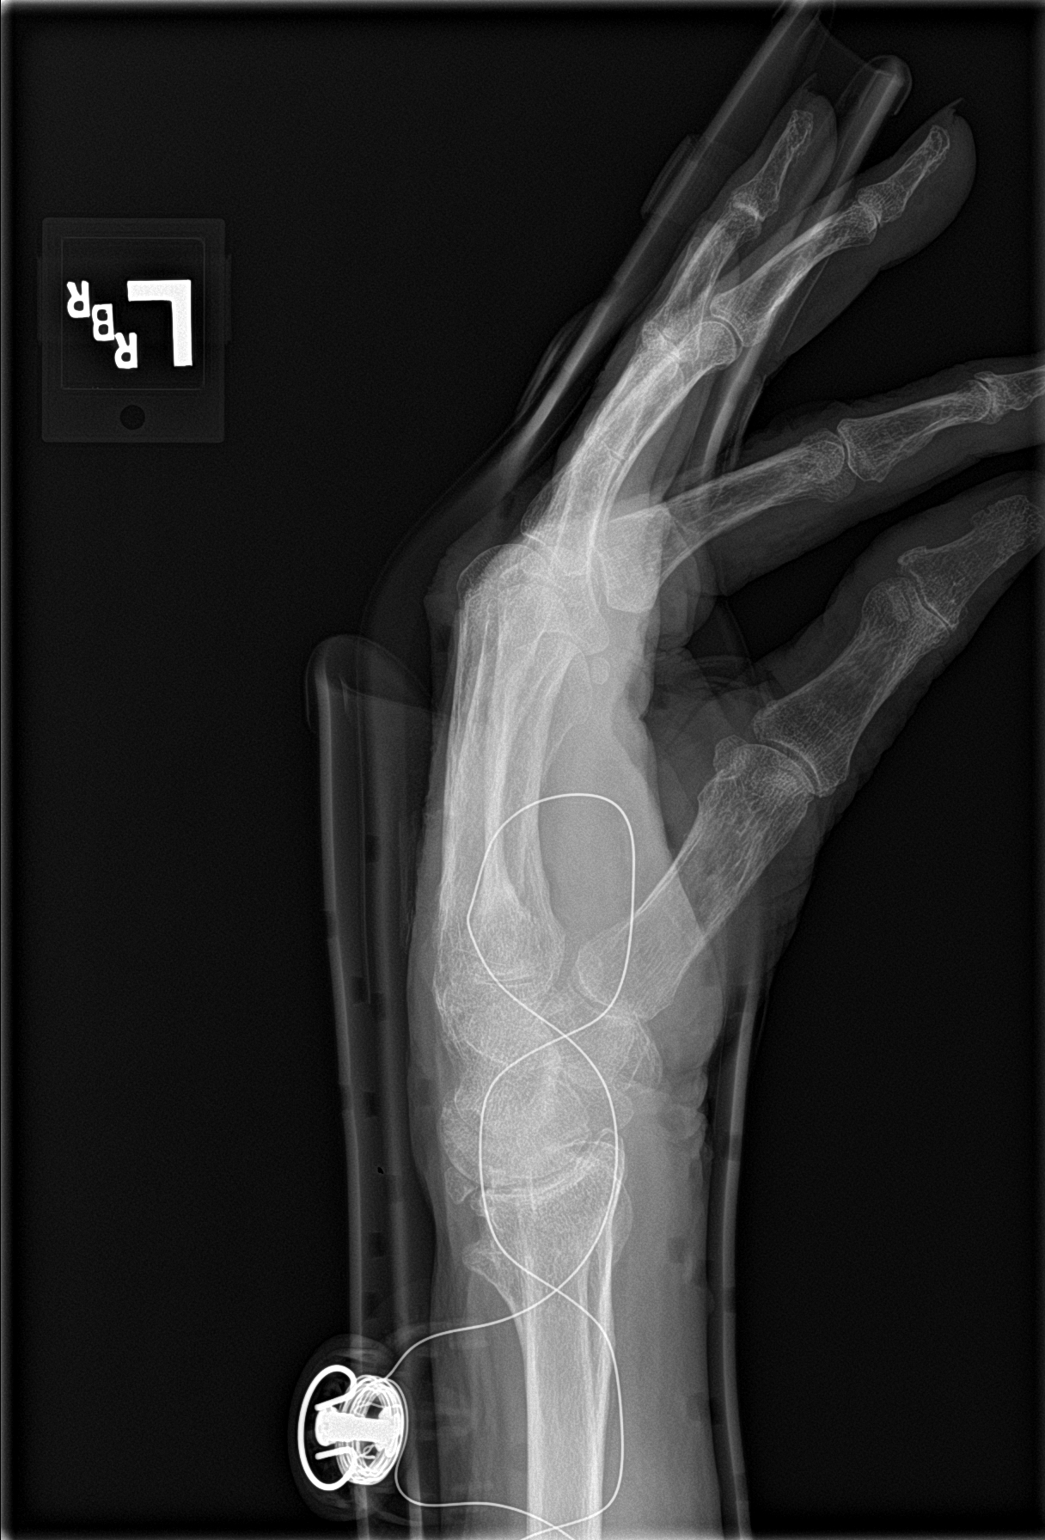

[3 of 3 positions shown; findings below may reference images not displayed]

FINDINGS: The bones have become progressively more osteopenic. The
nondisplaced fracture involving the base of the fifth metacarpal
bone appears less distinct. Chronic fracture involving the ulnar
styloid is again noted. No new fractures identified.
IMPRESSION: 1. Stable appearance of base of fifth metacarpal bone fracture.
2. Progressive osteopenia likely secondary to immobilization.

## 2016-04-12 DIAGNOSIS — I10 Essential (primary) hypertension: Secondary | ICD-10-CM | POA: Diagnosis not present

## 2016-04-19 DIAGNOSIS — N359 Urethral stricture, unspecified: Secondary | ICD-10-CM | POA: Diagnosis not present

## 2016-04-19 DIAGNOSIS — R350 Frequency of micturition: Secondary | ICD-10-CM | POA: Diagnosis not present

## 2016-04-19 DIAGNOSIS — R300 Dysuria: Secondary | ICD-10-CM | POA: Diagnosis not present

## 2016-04-19 DIAGNOSIS — I2510 Atherosclerotic heart disease of native coronary artery without angina pectoris: Secondary | ICD-10-CM | POA: Diagnosis not present

## 2016-04-24 DIAGNOSIS — I495 Sick sinus syndrome: Secondary | ICD-10-CM | POA: Diagnosis not present

## 2016-04-24 DIAGNOSIS — Z45018 Encounter for adjustment and management of other part of cardiac pacemaker: Secondary | ICD-10-CM | POA: Diagnosis not present

## 2016-04-26 DIAGNOSIS — H353221 Exudative age-related macular degeneration, left eye, with active choroidal neovascularization: Secondary | ICD-10-CM | POA: Diagnosis not present

## 2016-04-26 DIAGNOSIS — H353213 Exudative age-related macular degeneration, right eye, with inactive scar: Secondary | ICD-10-CM | POA: Diagnosis not present

## 2016-05-02 DIAGNOSIS — I10 Essential (primary) hypertension: Secondary | ICD-10-CM | POA: Diagnosis not present

## 2016-05-17 DIAGNOSIS — E785 Hyperlipidemia, unspecified: Secondary | ICD-10-CM | POA: Diagnosis not present

## 2016-05-17 DIAGNOSIS — G4700 Insomnia, unspecified: Secondary | ICD-10-CM | POA: Diagnosis not present

## 2016-05-17 DIAGNOSIS — I10 Essential (primary) hypertension: Secondary | ICD-10-CM | POA: Diagnosis not present

## 2016-05-17 DIAGNOSIS — I2510 Atherosclerotic heart disease of native coronary artery without angina pectoris: Secondary | ICD-10-CM | POA: Diagnosis not present

## 2016-05-26 DIAGNOSIS — N359 Urethral stricture, unspecified: Secondary | ICD-10-CM | POA: Diagnosis not present

## 2016-05-26 DIAGNOSIS — R319 Hematuria, unspecified: Secondary | ICD-10-CM | POA: Diagnosis not present

## 2016-05-26 DIAGNOSIS — N390 Urinary tract infection, site not specified: Secondary | ICD-10-CM | POA: Diagnosis not present

## 2016-06-08 DIAGNOSIS — Z950 Presence of cardiac pacemaker: Secondary | ICD-10-CM | POA: Diagnosis not present

## 2016-06-08 DIAGNOSIS — I2510 Atherosclerotic heart disease of native coronary artery without angina pectoris: Secondary | ICD-10-CM | POA: Diagnosis not present

## 2016-06-08 DIAGNOSIS — I10 Essential (primary) hypertension: Secondary | ICD-10-CM | POA: Diagnosis not present

## 2016-06-09 DIAGNOSIS — Z961 Presence of intraocular lens: Secondary | ICD-10-CM | POA: Diagnosis not present

## 2016-06-09 DIAGNOSIS — H353230 Exudative age-related macular degeneration, bilateral, stage unspecified: Secondary | ICD-10-CM | POA: Diagnosis not present

## 2016-06-13 DIAGNOSIS — R509 Fever, unspecified: Secondary | ICD-10-CM | POA: Diagnosis not present

## 2016-06-13 DIAGNOSIS — R05 Cough: Secondary | ICD-10-CM | POA: Diagnosis not present

## 2016-06-13 DIAGNOSIS — R062 Wheezing: Secondary | ICD-10-CM | POA: Diagnosis not present

## 2016-06-13 DIAGNOSIS — R0989 Other specified symptoms and signs involving the circulatory and respiratory systems: Secondary | ICD-10-CM | POA: Diagnosis not present

## 2016-06-21 DIAGNOSIS — R0989 Other specified symptoms and signs involving the circulatory and respiratory systems: Secondary | ICD-10-CM | POA: Diagnosis not present

## 2016-06-21 DIAGNOSIS — R05 Cough: Secondary | ICD-10-CM | POA: Diagnosis not present

## 2016-06-23 DIAGNOSIS — R001 Bradycardia, unspecified: Secondary | ICD-10-CM | POA: Diagnosis not present

## 2016-06-23 DIAGNOSIS — I10 Essential (primary) hypertension: Secondary | ICD-10-CM | POA: Diagnosis not present

## 2016-06-23 DIAGNOSIS — Z950 Presence of cardiac pacemaker: Secondary | ICD-10-CM | POA: Diagnosis not present

## 2016-07-07 DIAGNOSIS — N390 Urinary tract infection, site not specified: Secondary | ICD-10-CM | POA: Diagnosis not present

## 2016-07-07 DIAGNOSIS — N359 Urethral stricture, unspecified: Secondary | ICD-10-CM | POA: Diagnosis not present

## 2016-07-14 DIAGNOSIS — R609 Edema, unspecified: Secondary | ICD-10-CM | POA: Diagnosis not present

## 2016-07-14 DIAGNOSIS — I2510 Atherosclerotic heart disease of native coronary artery without angina pectoris: Secondary | ICD-10-CM | POA: Diagnosis not present

## 2016-07-14 DIAGNOSIS — I10 Essential (primary) hypertension: Secondary | ICD-10-CM | POA: Diagnosis not present

## 2016-07-14 DIAGNOSIS — N139 Obstructive and reflux uropathy, unspecified: Secondary | ICD-10-CM | POA: Diagnosis not present

## 2016-07-14 DIAGNOSIS — M1991 Primary osteoarthritis, unspecified site: Secondary | ICD-10-CM | POA: Diagnosis not present

## 2016-07-25 DIAGNOSIS — R278 Other lack of coordination: Secondary | ICD-10-CM | POA: Diagnosis not present

## 2016-07-25 DIAGNOSIS — M25561 Pain in right knee: Secondary | ICD-10-CM | POA: Diagnosis not present

## 2016-07-25 DIAGNOSIS — M6281 Muscle weakness (generalized): Secondary | ICD-10-CM | POA: Diagnosis not present

## 2016-07-26 DIAGNOSIS — M6281 Muscle weakness (generalized): Secondary | ICD-10-CM | POA: Diagnosis not present

## 2016-07-26 DIAGNOSIS — R278 Other lack of coordination: Secondary | ICD-10-CM | POA: Diagnosis not present

## 2016-07-26 DIAGNOSIS — M25561 Pain in right knee: Secondary | ICD-10-CM | POA: Diagnosis not present

## 2016-07-31 DIAGNOSIS — M6281 Muscle weakness (generalized): Secondary | ICD-10-CM | POA: Diagnosis not present

## 2016-07-31 DIAGNOSIS — R278 Other lack of coordination: Secondary | ICD-10-CM | POA: Diagnosis not present

## 2016-07-31 DIAGNOSIS — M25561 Pain in right knee: Secondary | ICD-10-CM | POA: Diagnosis not present

## 2016-08-02 DIAGNOSIS — R278 Other lack of coordination: Secondary | ICD-10-CM | POA: Diagnosis not present

## 2016-08-02 DIAGNOSIS — M6281 Muscle weakness (generalized): Secondary | ICD-10-CM | POA: Diagnosis not present

## 2016-08-02 DIAGNOSIS — M25561 Pain in right knee: Secondary | ICD-10-CM | POA: Diagnosis not present

## 2016-08-04 DIAGNOSIS — M25561 Pain in right knee: Secondary | ICD-10-CM | POA: Diagnosis not present

## 2016-08-04 DIAGNOSIS — M6281 Muscle weakness (generalized): Secondary | ICD-10-CM | POA: Diagnosis not present

## 2016-08-04 DIAGNOSIS — R278 Other lack of coordination: Secondary | ICD-10-CM | POA: Diagnosis not present

## 2016-08-07 DIAGNOSIS — Z950 Presence of cardiac pacemaker: Secondary | ICD-10-CM | POA: Diagnosis not present

## 2016-08-07 DIAGNOSIS — Z45018 Encounter for adjustment and management of other part of cardiac pacemaker: Secondary | ICD-10-CM | POA: Diagnosis not present

## 2016-08-09 DIAGNOSIS — H353221 Exudative age-related macular degeneration, left eye, with active choroidal neovascularization: Secondary | ICD-10-CM | POA: Diagnosis not present

## 2016-08-09 DIAGNOSIS — M6281 Muscle weakness (generalized): Secondary | ICD-10-CM | POA: Diagnosis not present

## 2016-08-09 DIAGNOSIS — H353213 Exudative age-related macular degeneration, right eye, with inactive scar: Secondary | ICD-10-CM | POA: Diagnosis not present

## 2016-08-09 DIAGNOSIS — R278 Other lack of coordination: Secondary | ICD-10-CM | POA: Diagnosis not present

## 2016-08-09 DIAGNOSIS — M25561 Pain in right knee: Secondary | ICD-10-CM | POA: Diagnosis not present

## 2016-08-10 DIAGNOSIS — R278 Other lack of coordination: Secondary | ICD-10-CM | POA: Diagnosis not present

## 2016-08-10 DIAGNOSIS — M25561 Pain in right knee: Secondary | ICD-10-CM | POA: Diagnosis not present

## 2016-08-10 DIAGNOSIS — M6281 Muscle weakness (generalized): Secondary | ICD-10-CM | POA: Diagnosis not present

## 2016-08-14 DIAGNOSIS — M25561 Pain in right knee: Secondary | ICD-10-CM | POA: Diagnosis not present

## 2016-08-14 DIAGNOSIS — M6281 Muscle weakness (generalized): Secondary | ICD-10-CM | POA: Diagnosis not present

## 2016-08-14 DIAGNOSIS — R278 Other lack of coordination: Secondary | ICD-10-CM | POA: Diagnosis not present

## 2016-08-17 DIAGNOSIS — M25561 Pain in right knee: Secondary | ICD-10-CM | POA: Diagnosis not present

## 2016-08-17 DIAGNOSIS — M6281 Muscle weakness (generalized): Secondary | ICD-10-CM | POA: Diagnosis not present

## 2016-08-17 DIAGNOSIS — R278 Other lack of coordination: Secondary | ICD-10-CM | POA: Diagnosis not present

## 2016-08-18 DIAGNOSIS — M6281 Muscle weakness (generalized): Secondary | ICD-10-CM | POA: Diagnosis not present

## 2016-08-18 DIAGNOSIS — M25561 Pain in right knee: Secondary | ICD-10-CM | POA: Diagnosis not present

## 2016-08-18 DIAGNOSIS — R278 Other lack of coordination: Secondary | ICD-10-CM | POA: Diagnosis not present

## 2016-08-21 DIAGNOSIS — R278 Other lack of coordination: Secondary | ICD-10-CM | POA: Diagnosis not present

## 2016-08-21 DIAGNOSIS — M25561 Pain in right knee: Secondary | ICD-10-CM | POA: Diagnosis not present

## 2016-08-21 DIAGNOSIS — M6281 Muscle weakness (generalized): Secondary | ICD-10-CM | POA: Diagnosis not present

## 2016-08-23 DIAGNOSIS — M6281 Muscle weakness (generalized): Secondary | ICD-10-CM | POA: Diagnosis not present

## 2016-08-23 DIAGNOSIS — R278 Other lack of coordination: Secondary | ICD-10-CM | POA: Diagnosis not present

## 2016-08-23 DIAGNOSIS — M25561 Pain in right knee: Secondary | ICD-10-CM | POA: Diagnosis not present

## 2016-08-24 DIAGNOSIS — M25561 Pain in right knee: Secondary | ICD-10-CM | POA: Diagnosis not present

## 2016-08-24 DIAGNOSIS — R278 Other lack of coordination: Secondary | ICD-10-CM | POA: Diagnosis not present

## 2016-08-24 DIAGNOSIS — M6281 Muscle weakness (generalized): Secondary | ICD-10-CM | POA: Diagnosis not present

## 2016-08-28 DIAGNOSIS — M25561 Pain in right knee: Secondary | ICD-10-CM | POA: Diagnosis not present

## 2016-08-28 DIAGNOSIS — M6281 Muscle weakness (generalized): Secondary | ICD-10-CM | POA: Diagnosis not present

## 2016-08-28 DIAGNOSIS — R278 Other lack of coordination: Secondary | ICD-10-CM | POA: Diagnosis not present

## 2016-08-30 DIAGNOSIS — N390 Urinary tract infection, site not specified: Secondary | ICD-10-CM | POA: Diagnosis not present

## 2016-08-30 DIAGNOSIS — R319 Hematuria, unspecified: Secondary | ICD-10-CM | POA: Diagnosis not present

## 2016-08-30 DIAGNOSIS — N9089 Other specified noninflammatory disorders of vulva and perineum: Secondary | ICD-10-CM | POA: Diagnosis not present

## 2016-08-31 DIAGNOSIS — M6281 Muscle weakness (generalized): Secondary | ICD-10-CM | POA: Diagnosis not present

## 2016-08-31 DIAGNOSIS — R278 Other lack of coordination: Secondary | ICD-10-CM | POA: Diagnosis not present

## 2016-08-31 DIAGNOSIS — M25561 Pain in right knee: Secondary | ICD-10-CM | POA: Diagnosis not present

## 2016-09-01 DIAGNOSIS — M25561 Pain in right knee: Secondary | ICD-10-CM | POA: Diagnosis not present

## 2016-09-01 DIAGNOSIS — R278 Other lack of coordination: Secondary | ICD-10-CM | POA: Diagnosis not present

## 2016-09-01 DIAGNOSIS — M6281 Muscle weakness (generalized): Secondary | ICD-10-CM | POA: Diagnosis not present

## 2016-09-04 DIAGNOSIS — R278 Other lack of coordination: Secondary | ICD-10-CM | POA: Diagnosis not present

## 2016-09-04 DIAGNOSIS — M6281 Muscle weakness (generalized): Secondary | ICD-10-CM | POA: Diagnosis not present

## 2016-09-04 DIAGNOSIS — M25561 Pain in right knee: Secondary | ICD-10-CM | POA: Diagnosis not present

## 2016-09-07 DIAGNOSIS — R278 Other lack of coordination: Secondary | ICD-10-CM | POA: Diagnosis not present

## 2016-09-07 DIAGNOSIS — M6281 Muscle weakness (generalized): Secondary | ICD-10-CM | POA: Diagnosis not present

## 2016-09-07 DIAGNOSIS — M25561 Pain in right knee: Secondary | ICD-10-CM | POA: Diagnosis not present

## 2016-09-08 DIAGNOSIS — R278 Other lack of coordination: Secondary | ICD-10-CM | POA: Diagnosis not present

## 2016-09-08 DIAGNOSIS — M25561 Pain in right knee: Secondary | ICD-10-CM | POA: Diagnosis not present

## 2016-09-08 DIAGNOSIS — M6281 Muscle weakness (generalized): Secondary | ICD-10-CM | POA: Diagnosis not present

## 2016-09-11 DIAGNOSIS — R278 Other lack of coordination: Secondary | ICD-10-CM | POA: Diagnosis not present

## 2016-09-11 DIAGNOSIS — M25561 Pain in right knee: Secondary | ICD-10-CM | POA: Diagnosis not present

## 2016-09-11 DIAGNOSIS — M6281 Muscle weakness (generalized): Secondary | ICD-10-CM | POA: Diagnosis not present

## 2016-09-12 DIAGNOSIS — M6281 Muscle weakness (generalized): Secondary | ICD-10-CM | POA: Diagnosis not present

## 2016-09-12 DIAGNOSIS — I2510 Atherosclerotic heart disease of native coronary artery without angina pectoris: Secondary | ICD-10-CM | POA: Diagnosis not present

## 2016-09-12 DIAGNOSIS — M150 Primary generalized (osteo)arthritis: Secondary | ICD-10-CM | POA: Diagnosis not present

## 2016-09-12 DIAGNOSIS — F0390 Unspecified dementia without behavioral disturbance: Secondary | ICD-10-CM | POA: Diagnosis not present

## 2016-09-12 DIAGNOSIS — R278 Other lack of coordination: Secondary | ICD-10-CM | POA: Diagnosis not present

## 2016-09-12 DIAGNOSIS — I10 Essential (primary) hypertension: Secondary | ICD-10-CM | POA: Diagnosis not present

## 2016-09-12 DIAGNOSIS — M25561 Pain in right knee: Secondary | ICD-10-CM | POA: Diagnosis not present

## 2016-09-15 DIAGNOSIS — M25561 Pain in right knee: Secondary | ICD-10-CM | POA: Diagnosis not present

## 2016-09-15 DIAGNOSIS — M6281 Muscle weakness (generalized): Secondary | ICD-10-CM | POA: Diagnosis not present

## 2016-09-15 DIAGNOSIS — R278 Other lack of coordination: Secondary | ICD-10-CM | POA: Diagnosis not present

## 2016-09-18 DIAGNOSIS — M25561 Pain in right knee: Secondary | ICD-10-CM | POA: Diagnosis not present

## 2016-09-18 DIAGNOSIS — M6281 Muscle weakness (generalized): Secondary | ICD-10-CM | POA: Diagnosis not present

## 2016-09-18 DIAGNOSIS — R278 Other lack of coordination: Secondary | ICD-10-CM | POA: Diagnosis not present

## 2016-10-09 DIAGNOSIS — I10 Essential (primary) hypertension: Secondary | ICD-10-CM | POA: Diagnosis not present

## 2016-10-25 DIAGNOSIS — F419 Anxiety disorder, unspecified: Secondary | ICD-10-CM | POA: Diagnosis not present

## 2016-10-25 DIAGNOSIS — G4700 Insomnia, unspecified: Secondary | ICD-10-CM | POA: Diagnosis not present

## 2016-10-26 DIAGNOSIS — I10 Essential (primary) hypertension: Secondary | ICD-10-CM | POA: Diagnosis not present

## 2016-10-27 DIAGNOSIS — M25562 Pain in left knee: Secondary | ICD-10-CM | POA: Diagnosis not present

## 2016-10-27 DIAGNOSIS — M25561 Pain in right knee: Secondary | ICD-10-CM | POA: Diagnosis not present

## 2016-11-13 DIAGNOSIS — I10 Essential (primary) hypertension: Secondary | ICD-10-CM | POA: Diagnosis not present

## 2016-11-13 DIAGNOSIS — N390 Urinary tract infection, site not specified: Secondary | ICD-10-CM | POA: Diagnosis not present

## 2016-11-13 DIAGNOSIS — I2510 Atherosclerotic heart disease of native coronary artery without angina pectoris: Secondary | ICD-10-CM | POA: Diagnosis not present

## 2016-11-13 DIAGNOSIS — N189 Chronic kidney disease, unspecified: Secondary | ICD-10-CM | POA: Diagnosis not present

## 2016-11-13 DIAGNOSIS — F0390 Unspecified dementia without behavioral disturbance: Secondary | ICD-10-CM | POA: Diagnosis not present

## 2016-11-23 DIAGNOSIS — N390 Urinary tract infection, site not specified: Secondary | ICD-10-CM | POA: Diagnosis not present

## 2016-11-23 DIAGNOSIS — R319 Hematuria, unspecified: Secondary | ICD-10-CM | POA: Diagnosis not present

## 2016-11-27 DIAGNOSIS — Z950 Presence of cardiac pacemaker: Secondary | ICD-10-CM | POA: Diagnosis not present

## 2016-11-27 DIAGNOSIS — Z45018 Encounter for adjustment and management of other part of cardiac pacemaker: Secondary | ICD-10-CM | POA: Diagnosis not present

## 2016-11-27 DIAGNOSIS — Z45010 Encounter for checking and testing of cardiac pacemaker pulse generator [battery]: Secondary | ICD-10-CM | POA: Diagnosis not present

## 2016-12-07 DIAGNOSIS — H353213 Exudative age-related macular degeneration, right eye, with inactive scar: Secondary | ICD-10-CM | POA: Diagnosis not present

## 2016-12-07 DIAGNOSIS — H353221 Exudative age-related macular degeneration, left eye, with active choroidal neovascularization: Secondary | ICD-10-CM | POA: Diagnosis not present

## 2017-01-11 DIAGNOSIS — I2510 Atherosclerotic heart disease of native coronary artery without angina pectoris: Secondary | ICD-10-CM | POA: Diagnosis not present

## 2017-01-11 DIAGNOSIS — Z950 Presence of cardiac pacemaker: Secondary | ICD-10-CM | POA: Diagnosis not present

## 2017-01-11 DIAGNOSIS — G629 Polyneuropathy, unspecified: Secondary | ICD-10-CM | POA: Diagnosis not present

## 2017-01-11 DIAGNOSIS — Z888 Allergy status to other drugs, medicaments and biological substances status: Secondary | ICD-10-CM | POA: Diagnosis not present

## 2017-01-11 DIAGNOSIS — H53149 Visual discomfort, unspecified: Secondary | ICD-10-CM | POA: Diagnosis not present

## 2017-01-11 DIAGNOSIS — I10 Essential (primary) hypertension: Secondary | ICD-10-CM | POA: Diagnosis not present

## 2017-01-11 DIAGNOSIS — Z885 Allergy status to narcotic agent status: Secondary | ICD-10-CM | POA: Diagnosis not present

## 2017-01-11 DIAGNOSIS — G319 Degenerative disease of nervous system, unspecified: Secondary | ICD-10-CM | POA: Diagnosis not present

## 2017-01-11 DIAGNOSIS — I480 Paroxysmal atrial fibrillation: Secondary | ICD-10-CM | POA: Diagnosis not present

## 2017-01-11 DIAGNOSIS — R9082 White matter disease, unspecified: Secondary | ICD-10-CM | POA: Diagnosis not present

## 2017-01-11 DIAGNOSIS — Z79899 Other long term (current) drug therapy: Secondary | ICD-10-CM | POA: Diagnosis not present

## 2017-01-11 DIAGNOSIS — Z96641 Presence of right artificial hip joint: Secondary | ICD-10-CM | POA: Diagnosis not present

## 2017-01-11 DIAGNOSIS — Z882 Allergy status to sulfonamides status: Secondary | ICD-10-CM | POA: Diagnosis not present

## 2017-01-11 DIAGNOSIS — F419 Anxiety disorder, unspecified: Secondary | ICD-10-CM | POA: Diagnosis not present

## 2017-01-11 DIAGNOSIS — R51 Headache: Secondary | ICD-10-CM | POA: Diagnosis not present

## 2017-01-11 DIAGNOSIS — I6782 Cerebral ischemia: Secondary | ICD-10-CM | POA: Diagnosis not present

## 2017-01-11 DIAGNOSIS — K449 Diaphragmatic hernia without obstruction or gangrene: Secondary | ICD-10-CM | POA: Diagnosis not present

## 2017-01-11 DIAGNOSIS — Z853 Personal history of malignant neoplasm of breast: Secondary | ICD-10-CM | POA: Diagnosis not present

## 2017-01-11 DIAGNOSIS — Z7982 Long term (current) use of aspirin: Secondary | ICD-10-CM | POA: Diagnosis not present

## 2017-01-16 DIAGNOSIS — H53149 Visual discomfort, unspecified: Secondary | ICD-10-CM | POA: Diagnosis not present

## 2017-01-16 DIAGNOSIS — G43009 Migraine without aura, not intractable, without status migrainosus: Secondary | ICD-10-CM | POA: Diagnosis not present

## 2017-01-23 DIAGNOSIS — I2510 Atherosclerotic heart disease of native coronary artery without angina pectoris: Secondary | ICD-10-CM | POA: Diagnosis not present

## 2017-01-23 DIAGNOSIS — F419 Anxiety disorder, unspecified: Secondary | ICD-10-CM | POA: Diagnosis not present

## 2017-01-23 DIAGNOSIS — M1990 Unspecified osteoarthritis, unspecified site: Secondary | ICD-10-CM | POA: Diagnosis not present

## 2017-01-23 DIAGNOSIS — I10 Essential (primary) hypertension: Secondary | ICD-10-CM | POA: Diagnosis not present

## 2017-01-31 DIAGNOSIS — F419 Anxiety disorder, unspecified: Secondary | ICD-10-CM | POA: Diagnosis not present

## 2017-01-31 DIAGNOSIS — G4700 Insomnia, unspecified: Secondary | ICD-10-CM | POA: Diagnosis not present

## 2017-01-31 DIAGNOSIS — F0390 Unspecified dementia without behavioral disturbance: Secondary | ICD-10-CM | POA: Diagnosis not present

## 2017-02-14 DIAGNOSIS — G4700 Insomnia, unspecified: Secondary | ICD-10-CM | POA: Diagnosis not present

## 2017-02-14 DIAGNOSIS — F419 Anxiety disorder, unspecified: Secondary | ICD-10-CM | POA: Diagnosis not present

## 2017-02-14 DIAGNOSIS — F0390 Unspecified dementia without behavioral disturbance: Secondary | ICD-10-CM | POA: Diagnosis not present

## 2017-03-05 DIAGNOSIS — Z45010 Encounter for checking and testing of cardiac pacemaker pulse generator [battery]: Secondary | ICD-10-CM | POA: Diagnosis not present

## 2017-03-05 DIAGNOSIS — Z45018 Encounter for adjustment and management of other part of cardiac pacemaker: Secondary | ICD-10-CM | POA: Diagnosis not present

## 2017-03-05 DIAGNOSIS — Z950 Presence of cardiac pacemaker: Secondary | ICD-10-CM | POA: Diagnosis not present

## 2017-03-12 DIAGNOSIS — F0391 Unspecified dementia with behavioral disturbance: Secondary | ICD-10-CM | POA: Diagnosis not present

## 2017-03-12 DIAGNOSIS — I2510 Atherosclerotic heart disease of native coronary artery without angina pectoris: Secondary | ICD-10-CM | POA: Diagnosis not present

## 2017-03-12 DIAGNOSIS — F339 Major depressive disorder, recurrent, unspecified: Secondary | ICD-10-CM | POA: Diagnosis not present

## 2017-03-12 DIAGNOSIS — I10 Essential (primary) hypertension: Secondary | ICD-10-CM | POA: Diagnosis not present

## 2017-03-12 DIAGNOSIS — F419 Anxiety disorder, unspecified: Secondary | ICD-10-CM | POA: Diagnosis not present

## 2017-03-14 DIAGNOSIS — Z23 Encounter for immunization: Secondary | ICD-10-CM | POA: Diagnosis not present

## 2017-03-14 DIAGNOSIS — F0390 Unspecified dementia without behavioral disturbance: Secondary | ICD-10-CM | POA: Diagnosis not present

## 2017-03-14 DIAGNOSIS — G4700 Insomnia, unspecified: Secondary | ICD-10-CM | POA: Diagnosis not present

## 2017-03-14 DIAGNOSIS — F419 Anxiety disorder, unspecified: Secondary | ICD-10-CM | POA: Diagnosis not present

## 2017-04-03 DIAGNOSIS — I10 Essential (primary) hypertension: Secondary | ICD-10-CM | POA: Diagnosis not present

## 2017-04-11 DIAGNOSIS — F0390 Unspecified dementia without behavioral disturbance: Secondary | ICD-10-CM | POA: Diagnosis not present

## 2017-04-11 DIAGNOSIS — G4700 Insomnia, unspecified: Secondary | ICD-10-CM | POA: Diagnosis not present

## 2017-04-11 DIAGNOSIS — F419 Anxiety disorder, unspecified: Secondary | ICD-10-CM | POA: Diagnosis not present

## 2017-04-16 DIAGNOSIS — H353221 Exudative age-related macular degeneration, left eye, with active choroidal neovascularization: Secondary | ICD-10-CM | POA: Diagnosis not present

## 2017-04-16 DIAGNOSIS — H353213 Exudative age-related macular degeneration, right eye, with inactive scar: Secondary | ICD-10-CM | POA: Diagnosis not present

## 2017-05-09 DIAGNOSIS — F600 Paranoid personality disorder: Secondary | ICD-10-CM | POA: Diagnosis not present

## 2017-05-09 DIAGNOSIS — G4700 Insomnia, unspecified: Secondary | ICD-10-CM | POA: Diagnosis not present

## 2017-05-09 DIAGNOSIS — F0390 Unspecified dementia without behavioral disturbance: Secondary | ICD-10-CM | POA: Diagnosis not present

## 2017-05-09 DIAGNOSIS — I2510 Atherosclerotic heart disease of native coronary artery without angina pectoris: Secondary | ICD-10-CM | POA: Diagnosis not present

## 2017-05-09 DIAGNOSIS — I10 Essential (primary) hypertension: Secondary | ICD-10-CM | POA: Diagnosis not present

## 2017-05-09 DIAGNOSIS — F419 Anxiety disorder, unspecified: Secondary | ICD-10-CM | POA: Diagnosis not present
